# Patient Record
Sex: Female | Born: 1940 | Race: Black or African American | Hispanic: No | Smoking: Former smoker
Health system: Southern US, Community
[De-identification: ages and names within clinical notes are randomized; demographics above are authoritative.]

## PROBLEM LIST (undated history)

## (undated) DIAGNOSIS — IMO0001 Reserved for inherently not codable concepts without codable children: Secondary | ICD-10-CM

## (undated) DIAGNOSIS — I679 Cerebrovascular disease, unspecified: Secondary | ICD-10-CM

## (undated) DIAGNOSIS — E785 Hyperlipidemia, unspecified: Secondary | ICD-10-CM

## (undated) DIAGNOSIS — I6502 Occlusion and stenosis of left vertebral artery: Secondary | ICD-10-CM

## (undated) DIAGNOSIS — I25119 Atherosclerotic heart disease of native coronary artery with unspecified angina pectoris: Secondary | ICD-10-CM

## (undated) DIAGNOSIS — E119 Type 2 diabetes mellitus without complications: Secondary | ICD-10-CM

## (undated) DIAGNOSIS — H04123 Dry eye syndrome of bilateral lacrimal glands: Secondary | ICD-10-CM

## (undated) DIAGNOSIS — K219 Gastro-esophageal reflux disease without esophagitis: Secondary | ICD-10-CM

## (undated) DIAGNOSIS — I10 Essential (primary) hypertension: Secondary | ICD-10-CM

## (undated) HISTORY — DX: Occlusion and stenosis of left vertebral artery: I65.02

## (undated) HISTORY — DX: Essential (primary) hypertension: I10

## (undated) HISTORY — DX: Cerebrovascular disease, unspecified: I67.9

## (undated) HISTORY — DX: Hyperlipidemia, unspecified: E78.5

## (undated) HISTORY — PX: TONSILLECTOMY: SHX5217

## (undated) HISTORY — DX: Reserved for inherently not codable concepts without codable children: IMO0001

## (undated) HISTORY — PX: NO PAST SURGERIES: SHX2092

## (undated) HISTORY — DX: Dry eye syndrome of bilateral lacrimal glands: H04.123

## (undated) HISTORY — DX: Atherosclerotic heart disease of native coronary artery with unspecified angina pectoris: I25.119

## (undated) HISTORY — PX: OTHER SURGICAL HISTORY: SHX169

## (undated) HISTORY — DX: Type 2 diabetes mellitus without complications: E11.9

## (undated) HISTORY — DX: Gastro-esophageal reflux disease without esophagitis: K21.9

---

## 1986-03-29 HISTORY — PX: ABDOMINAL HYSTERECTOMY: SHX81

## 1999-01-21 ENCOUNTER — Encounter: Admission: RE | Admit: 1999-01-21 | Discharge: 1999-01-21 | Payer: Self-pay | Admitting: Internal Medicine

## 1999-01-21 ENCOUNTER — Encounter: Payer: Self-pay | Admitting: Internal Medicine

## 2000-01-11 ENCOUNTER — Encounter: Admission: RE | Admit: 2000-01-11 | Discharge: 2000-04-10 | Payer: Self-pay | Admitting: Internal Medicine

## 2000-01-22 ENCOUNTER — Encounter: Payer: Self-pay | Admitting: Internal Medicine

## 2000-01-22 ENCOUNTER — Encounter: Admission: RE | Admit: 2000-01-22 | Discharge: 2000-01-22 | Payer: Self-pay | Admitting: Internal Medicine

## 2001-01-23 ENCOUNTER — Encounter: Admission: RE | Admit: 2001-01-23 | Discharge: 2001-01-23 | Payer: Self-pay | Admitting: Internal Medicine

## 2001-01-23 ENCOUNTER — Encounter: Payer: Self-pay | Admitting: Internal Medicine

## 2002-03-29 DIAGNOSIS — I25119 Atherosclerotic heart disease of native coronary artery with unspecified angina pectoris: Secondary | ICD-10-CM

## 2002-03-29 HISTORY — DX: Atherosclerotic heart disease of native coronary artery with unspecified angina pectoris: I25.119

## 2002-10-08 ENCOUNTER — Encounter: Payer: Self-pay | Admitting: Internal Medicine

## 2002-10-08 ENCOUNTER — Encounter: Admission: RE | Admit: 2002-10-08 | Discharge: 2002-10-08 | Payer: Self-pay | Admitting: Internal Medicine

## 2002-11-17 ENCOUNTER — Emergency Department (HOSPITAL_COMMUNITY): Admission: EM | Admit: 2002-11-17 | Discharge: 2002-11-17 | Payer: Self-pay | Admitting: Emergency Medicine

## 2003-01-08 ENCOUNTER — Ambulatory Visit (HOSPITAL_COMMUNITY): Admission: RE | Admit: 2003-01-08 | Discharge: 2003-01-09 | Payer: Self-pay | Admitting: Interventional Cardiology

## 2004-01-20 ENCOUNTER — Encounter: Admission: RE | Admit: 2004-01-20 | Discharge: 2004-01-20 | Payer: Self-pay | Admitting: Internal Medicine

## 2004-10-30 ENCOUNTER — Encounter: Admission: RE | Admit: 2004-10-30 | Discharge: 2004-10-30 | Payer: Self-pay | Admitting: Internal Medicine

## 2005-02-01 ENCOUNTER — Encounter: Admission: RE | Admit: 2005-02-01 | Discharge: 2005-02-01 | Payer: Self-pay | Admitting: Gastroenterology

## 2005-02-08 ENCOUNTER — Encounter: Admission: RE | Admit: 2005-02-08 | Discharge: 2005-02-08 | Payer: Self-pay | Admitting: Internal Medicine

## 2006-03-31 ENCOUNTER — Encounter: Admission: RE | Admit: 2006-03-31 | Discharge: 2006-03-31 | Payer: Self-pay | Admitting: Internal Medicine

## 2007-04-12 ENCOUNTER — Encounter: Admission: RE | Admit: 2007-04-12 | Discharge: 2007-04-12 | Payer: Self-pay | Admitting: Internal Medicine

## 2008-04-23 ENCOUNTER — Encounter: Admission: RE | Admit: 2008-04-23 | Discharge: 2008-04-23 | Payer: Self-pay | Admitting: Internal Medicine

## 2009-04-28 ENCOUNTER — Encounter: Admission: RE | Admit: 2009-04-28 | Discharge: 2009-04-28 | Payer: Self-pay | Admitting: Internal Medicine

## 2010-04-18 ENCOUNTER — Other Ambulatory Visit: Payer: Self-pay | Admitting: Internal Medicine

## 2010-04-18 DIAGNOSIS — Z1239 Encounter for other screening for malignant neoplasm of breast: Secondary | ICD-10-CM

## 2010-05-01 ENCOUNTER — Ambulatory Visit: Payer: Self-pay

## 2010-05-14 ENCOUNTER — Ambulatory Visit
Admission: RE | Admit: 2010-05-14 | Discharge: 2010-05-14 | Disposition: A | Discharge status: Home / Self Care | Payer: Medicare Other | Source: Ambulatory Visit | Attending: Internal Medicine | Admitting: Internal Medicine

## 2010-05-14 DIAGNOSIS — Z1239 Encounter for other screening for malignant neoplasm of breast: Secondary | ICD-10-CM

## 2011-05-06 ENCOUNTER — Other Ambulatory Visit: Payer: Self-pay | Admitting: Internal Medicine

## 2011-05-06 DIAGNOSIS — Z1231 Encounter for screening mammogram for malignant neoplasm of breast: Secondary | ICD-10-CM

## 2011-05-19 ENCOUNTER — Ambulatory Visit
Admission: RE | Admit: 2011-05-19 | Discharge: 2011-05-19 | Disposition: A | Discharge status: Home / Self Care | Payer: BC Managed Care – PPO | Source: Ambulatory Visit | Attending: Internal Medicine | Admitting: Internal Medicine

## 2011-05-19 DIAGNOSIS — Z1231 Encounter for screening mammogram for malignant neoplasm of breast: Secondary | ICD-10-CM

## 2011-06-16 DIAGNOSIS — I10 Essential (primary) hypertension: Secondary | ICD-10-CM | POA: Diagnosis not present

## 2011-06-16 DIAGNOSIS — Z1331 Encounter for screening for depression: Secondary | ICD-10-CM | POA: Diagnosis not present

## 2011-06-16 DIAGNOSIS — E119 Type 2 diabetes mellitus without complications: Secondary | ICD-10-CM | POA: Diagnosis not present

## 2011-06-16 DIAGNOSIS — E785 Hyperlipidemia, unspecified: Secondary | ICD-10-CM | POA: Diagnosis not present

## 2011-07-09 DIAGNOSIS — E119 Type 2 diabetes mellitus without complications: Secondary | ICD-10-CM | POA: Diagnosis not present

## 2011-07-09 DIAGNOSIS — H2510 Age-related nuclear cataract, unspecified eye: Secondary | ICD-10-CM | POA: Diagnosis not present

## 2011-10-26 DIAGNOSIS — I10 Essential (primary) hypertension: Secondary | ICD-10-CM | POA: Diagnosis not present

## 2011-10-26 DIAGNOSIS — R05 Cough: Secondary | ICD-10-CM | POA: Diagnosis not present

## 2011-10-26 DIAGNOSIS — E119 Type 2 diabetes mellitus without complications: Secondary | ICD-10-CM | POA: Diagnosis not present

## 2011-12-06 DIAGNOSIS — E119 Type 2 diabetes mellitus without complications: Secondary | ICD-10-CM | POA: Diagnosis not present

## 2011-12-06 DIAGNOSIS — I679 Cerebrovascular disease, unspecified: Secondary | ICD-10-CM | POA: Diagnosis not present

## 2011-12-06 DIAGNOSIS — I2510 Atherosclerotic heart disease of native coronary artery without angina pectoris: Secondary | ICD-10-CM | POA: Diagnosis not present

## 2011-12-06 DIAGNOSIS — I10 Essential (primary) hypertension: Secondary | ICD-10-CM | POA: Diagnosis not present

## 2012-01-11 DIAGNOSIS — H2510 Age-related nuclear cataract, unspecified eye: Secondary | ICD-10-CM | POA: Diagnosis not present

## 2012-01-11 DIAGNOSIS — E119 Type 2 diabetes mellitus without complications: Secondary | ICD-10-CM | POA: Diagnosis not present

## 2012-02-07 DIAGNOSIS — Z23 Encounter for immunization: Secondary | ICD-10-CM | POA: Diagnosis not present

## 2012-02-07 DIAGNOSIS — Z0000 Encounter for general adult medical examination without abnormal findings: Secondary | ICD-10-CM | POA: Diagnosis not present

## 2012-02-07 DIAGNOSIS — Z1331 Encounter for screening for depression: Secondary | ICD-10-CM | POA: Diagnosis not present

## 2012-02-07 DIAGNOSIS — I10 Essential (primary) hypertension: Secondary | ICD-10-CM | POA: Diagnosis not present

## 2012-02-07 DIAGNOSIS — N390 Urinary tract infection, site not specified: Secondary | ICD-10-CM | POA: Diagnosis not present

## 2012-02-07 DIAGNOSIS — E119 Type 2 diabetes mellitus without complications: Secondary | ICD-10-CM | POA: Diagnosis not present

## 2012-03-09 DIAGNOSIS — H524 Presbyopia: Secondary | ICD-10-CM | POA: Diagnosis not present

## 2012-05-15 DIAGNOSIS — R05 Cough: Secondary | ICD-10-CM | POA: Diagnosis not present

## 2012-05-15 DIAGNOSIS — R509 Fever, unspecified: Secondary | ICD-10-CM | POA: Diagnosis not present

## 2012-06-05 DIAGNOSIS — N390 Urinary tract infection, site not specified: Secondary | ICD-10-CM | POA: Diagnosis not present

## 2012-06-05 DIAGNOSIS — E119 Type 2 diabetes mellitus without complications: Secondary | ICD-10-CM | POA: Diagnosis not present

## 2012-06-05 DIAGNOSIS — I10 Essential (primary) hypertension: Secondary | ICD-10-CM | POA: Diagnosis not present

## 2012-06-26 ENCOUNTER — Other Ambulatory Visit: Payer: Self-pay

## 2012-06-26 DIAGNOSIS — Z1231 Encounter for screening mammogram for malignant neoplasm of breast: Secondary | ICD-10-CM

## 2012-07-26 ENCOUNTER — Ambulatory Visit
Admission: RE | Admit: 2012-07-26 | Discharge: 2012-07-26 | Disposition: A | Discharge status: Home / Self Care | Payer: Medicare Other | Source: Ambulatory Visit

## 2012-07-26 DIAGNOSIS — Z1231 Encounter for screening mammogram for malignant neoplasm of breast: Secondary | ICD-10-CM

## 2012-12-04 DIAGNOSIS — I679 Cerebrovascular disease, unspecified: Secondary | ICD-10-CM | POA: Diagnosis not present

## 2012-12-04 DIAGNOSIS — E785 Hyperlipidemia, unspecified: Secondary | ICD-10-CM | POA: Diagnosis not present

## 2012-12-04 DIAGNOSIS — I2510 Atherosclerotic heart disease of native coronary artery without angina pectoris: Secondary | ICD-10-CM | POA: Diagnosis not present

## 2012-12-04 DIAGNOSIS — I10 Essential (primary) hypertension: Secondary | ICD-10-CM | POA: Diagnosis not present

## 2013-04-05 DIAGNOSIS — I10 Essential (primary) hypertension: Secondary | ICD-10-CM | POA: Diagnosis not present

## 2013-04-05 DIAGNOSIS — E785 Hyperlipidemia, unspecified: Secondary | ICD-10-CM | POA: Diagnosis not present

## 2013-04-05 DIAGNOSIS — Z23 Encounter for immunization: Secondary | ICD-10-CM | POA: Diagnosis not present

## 2013-04-05 DIAGNOSIS — E119 Type 2 diabetes mellitus without complications: Secondary | ICD-10-CM | POA: Diagnosis not present

## 2013-06-13 DIAGNOSIS — E1139 Type 2 diabetes mellitus with other diabetic ophthalmic complication: Secondary | ICD-10-CM | POA: Diagnosis not present

## 2013-06-13 DIAGNOSIS — E1136 Type 2 diabetes mellitus with diabetic cataract: Secondary | ICD-10-CM | POA: Diagnosis not present

## 2013-06-26 ENCOUNTER — Other Ambulatory Visit: Payer: Self-pay

## 2013-06-26 DIAGNOSIS — Z1231 Encounter for screening mammogram for malignant neoplasm of breast: Secondary | ICD-10-CM

## 2013-07-27 ENCOUNTER — Encounter (INDEPENDENT_AMBULATORY_CARE_PROVIDER_SITE_OTHER): Payer: Self-pay

## 2013-07-27 ENCOUNTER — Ambulatory Visit
Admission: RE | Admit: 2013-07-27 | Discharge: 2013-07-27 | Disposition: A | Discharge status: Home / Self Care | Payer: Medicare Other | Source: Ambulatory Visit

## 2013-07-27 DIAGNOSIS — Z1231 Encounter for screening mammogram for malignant neoplasm of breast: Secondary | ICD-10-CM | POA: Diagnosis not present

## 2013-08-02 DIAGNOSIS — E119 Type 2 diabetes mellitus without complications: Secondary | ICD-10-CM | POA: Diagnosis not present

## 2013-08-02 DIAGNOSIS — Z23 Encounter for immunization: Secondary | ICD-10-CM | POA: Diagnosis not present

## 2013-08-02 DIAGNOSIS — I10 Essential (primary) hypertension: Secondary | ICD-10-CM | POA: Diagnosis not present

## 2013-08-02 DIAGNOSIS — Z1331 Encounter for screening for depression: Secondary | ICD-10-CM | POA: Diagnosis not present

## 2013-09-20 DIAGNOSIS — R21 Rash and other nonspecific skin eruption: Secondary | ICD-10-CM | POA: Diagnosis not present

## 2013-10-09 DIAGNOSIS — R21 Rash and other nonspecific skin eruption: Secondary | ICD-10-CM | POA: Diagnosis not present

## 2013-12-04 DIAGNOSIS — I10 Essential (primary) hypertension: Secondary | ICD-10-CM | POA: Diagnosis not present

## 2013-12-04 DIAGNOSIS — E785 Hyperlipidemia, unspecified: Secondary | ICD-10-CM | POA: Diagnosis not present

## 2013-12-04 DIAGNOSIS — Z23 Encounter for immunization: Secondary | ICD-10-CM | POA: Diagnosis not present

## 2013-12-04 DIAGNOSIS — Z1331 Encounter for screening for depression: Secondary | ICD-10-CM | POA: Diagnosis not present

## 2013-12-04 DIAGNOSIS — E119 Type 2 diabetes mellitus without complications: Secondary | ICD-10-CM | POA: Diagnosis not present

## 2014-03-26 DIAGNOSIS — M5432 Sciatica, left side: Secondary | ICD-10-CM | POA: Diagnosis not present

## 2014-04-09 ENCOUNTER — Other Ambulatory Visit: Payer: Self-pay | Admitting: Internal Medicine

## 2014-04-09 DIAGNOSIS — M5430 Sciatica, unspecified side: Secondary | ICD-10-CM | POA: Diagnosis not present

## 2014-04-09 DIAGNOSIS — E1151 Type 2 diabetes mellitus with diabetic peripheral angiopathy without gangrene: Secondary | ICD-10-CM | POA: Diagnosis not present

## 2014-04-09 DIAGNOSIS — K56699 Other intestinal obstruction unspecified as to partial versus complete obstruction: Secondary | ICD-10-CM

## 2014-04-09 DIAGNOSIS — I10 Essential (primary) hypertension: Secondary | ICD-10-CM | POA: Diagnosis not present

## 2014-04-09 DIAGNOSIS — I739 Peripheral vascular disease, unspecified: Secondary | ICD-10-CM | POA: Diagnosis not present

## 2014-04-22 ENCOUNTER — Inpatient Hospital Stay
Admission: RE | Admit: 2014-04-22 | Discharge: 2014-04-22 | Disposition: A | Discharge status: Home / Self Care | Payer: Medicare Other | Source: Ambulatory Visit | Attending: Internal Medicine | Admitting: Internal Medicine

## 2014-08-30 DIAGNOSIS — E1151 Type 2 diabetes mellitus with diabetic peripheral angiopathy without gangrene: Secondary | ICD-10-CM | POA: Diagnosis not present

## 2014-08-30 DIAGNOSIS — I10 Essential (primary) hypertension: Secondary | ICD-10-CM | POA: Diagnosis not present

## 2014-08-30 DIAGNOSIS — Z23 Encounter for immunization: Secondary | ICD-10-CM | POA: Diagnosis not present

## 2015-01-06 DIAGNOSIS — E119 Type 2 diabetes mellitus without complications: Secondary | ICD-10-CM | POA: Diagnosis not present

## 2015-01-06 DIAGNOSIS — E7800 Pure hypercholesterolemia, unspecified: Secondary | ICD-10-CM | POA: Diagnosis not present

## 2015-01-06 DIAGNOSIS — I10 Essential (primary) hypertension: Secondary | ICD-10-CM | POA: Diagnosis not present

## 2015-01-06 DIAGNOSIS — Z1389 Encounter for screening for other disorder: Secondary | ICD-10-CM | POA: Diagnosis not present

## 2015-01-06 DIAGNOSIS — Z23 Encounter for immunization: Secondary | ICD-10-CM | POA: Diagnosis not present

## 2015-01-16 DIAGNOSIS — H25013 Cortical age-related cataract, bilateral: Secondary | ICD-10-CM | POA: Diagnosis not present

## 2015-01-16 DIAGNOSIS — Z0100 Encounter for examination of eyes and vision without abnormal findings: Secondary | ICD-10-CM | POA: Diagnosis not present

## 2015-01-16 DIAGNOSIS — E119 Type 2 diabetes mellitus without complications: Secondary | ICD-10-CM | POA: Diagnosis not present

## 2015-01-27 ENCOUNTER — Other Ambulatory Visit: Payer: Self-pay

## 2015-01-27 DIAGNOSIS — Z1231 Encounter for screening mammogram for malignant neoplasm of breast: Secondary | ICD-10-CM

## 2015-02-27 ENCOUNTER — Ambulatory Visit
Admission: RE | Admit: 2015-02-27 | Discharge: 2015-02-27 | Disposition: A | Discharge status: Home / Self Care | Payer: Medicare Other | Source: Ambulatory Visit

## 2015-02-27 DIAGNOSIS — Z1231 Encounter for screening mammogram for malignant neoplasm of breast: Secondary | ICD-10-CM

## 2015-04-03 DIAGNOSIS — M5430 Sciatica, unspecified side: Secondary | ICD-10-CM | POA: Diagnosis not present

## 2015-04-03 DIAGNOSIS — M25569 Pain in unspecified knee: Secondary | ICD-10-CM | POA: Diagnosis not present

## 2015-05-09 DIAGNOSIS — I10 Essential (primary) hypertension: Secondary | ICD-10-CM | POA: Insufficient documentation

## 2015-05-09 DIAGNOSIS — E785 Hyperlipidemia, unspecified: Secondary | ICD-10-CM | POA: Insufficient documentation

## 2015-05-09 DIAGNOSIS — K219 Gastro-esophageal reflux disease without esophagitis: Secondary | ICD-10-CM

## 2015-05-09 DIAGNOSIS — E119 Type 2 diabetes mellitus without complications: Secondary | ICD-10-CM | POA: Insufficient documentation

## 2015-05-09 DIAGNOSIS — I25119 Atherosclerotic heart disease of native coronary artery with unspecified angina pectoris: Secondary | ICD-10-CM | POA: Insufficient documentation

## 2015-05-09 DIAGNOSIS — H04123 Dry eye syndrome of bilateral lacrimal glands: Secondary | ICD-10-CM | POA: Insufficient documentation

## 2015-05-09 DIAGNOSIS — I6502 Occlusion and stenosis of left vertebral artery: Secondary | ICD-10-CM | POA: Insufficient documentation

## 2015-05-09 DIAGNOSIS — IMO0001 Reserved for inherently not codable concepts without codable children: Secondary | ICD-10-CM | POA: Insufficient documentation

## 2015-05-09 DIAGNOSIS — I679 Cerebrovascular disease, unspecified: Secondary | ICD-10-CM | POA: Insufficient documentation

## 2015-05-13 ENCOUNTER — Ambulatory Visit
Admission: RE | Admit: 2015-05-13 | Discharge: 2015-05-13 | Disposition: A | Discharge status: Home / Self Care | Payer: Medicare Other | Source: Ambulatory Visit | Attending: Internal Medicine | Admitting: Internal Medicine

## 2015-05-13 ENCOUNTER — Other Ambulatory Visit: Payer: Self-pay | Admitting: Internal Medicine

## 2015-05-13 DIAGNOSIS — Z7984 Long term (current) use of oral hypoglycemic drugs: Secondary | ICD-10-CM | POA: Diagnosis not present

## 2015-05-13 DIAGNOSIS — M25562 Pain in left knee: Secondary | ICD-10-CM

## 2015-05-13 DIAGNOSIS — M5432 Sciatica, left side: Secondary | ICD-10-CM | POA: Diagnosis not present

## 2015-05-13 DIAGNOSIS — E119 Type 2 diabetes mellitus without complications: Secondary | ICD-10-CM | POA: Diagnosis not present

## 2015-05-13 DIAGNOSIS — I10 Essential (primary) hypertension: Secondary | ICD-10-CM | POA: Diagnosis not present

## 2015-05-13 DIAGNOSIS — M179 Osteoarthritis of knee, unspecified: Secondary | ICD-10-CM | POA: Diagnosis not present

## 2015-05-15 DIAGNOSIS — M25562 Pain in left knee: Secondary | ICD-10-CM | POA: Diagnosis not present

## 2015-05-15 DIAGNOSIS — M545 Low back pain: Secondary | ICD-10-CM | POA: Diagnosis not present

## 2015-05-16 ENCOUNTER — Encounter: Payer: Self-pay | Admitting: Interventional Cardiology

## 2015-05-16 ENCOUNTER — Ambulatory Visit (INDEPENDENT_AMBULATORY_CARE_PROVIDER_SITE_OTHER): Payer: Medicare Other | Admitting: Interventional Cardiology

## 2015-05-16 VITALS — BP 132/74 | HR 81 | Ht 69.0 in | Wt 141.8 lb

## 2015-05-16 DIAGNOSIS — E785 Hyperlipidemia, unspecified: Secondary | ICD-10-CM

## 2015-05-16 DIAGNOSIS — I10 Essential (primary) hypertension: Secondary | ICD-10-CM | POA: Diagnosis not present

## 2015-05-16 DIAGNOSIS — E119 Type 2 diabetes mellitus without complications: Secondary | ICD-10-CM | POA: Diagnosis not present

## 2015-05-16 DIAGNOSIS — I25119 Atherosclerotic heart disease of native coronary artery with unspecified angina pectoris: Secondary | ICD-10-CM | POA: Diagnosis not present

## 2015-05-16 DIAGNOSIS — I6502 Occlusion and stenosis of left vertebral artery: Secondary | ICD-10-CM

## 2015-05-16 NOTE — Progress Notes (Signed)
Cardiology Office Note   Date:  05/16/2015   ID:  Mackenzie Gibbs, DOB 12/14/1940, MRN [id number]  PCP:  Irven Shelling, MD  Cardiologist:  Sinclair Grooms, MD   Chief Complaint  Patient presents with  . Coronary Artery Disease      History of Present Illness: Mackenzie Gibbs is a 75 y.o. female who presents for  Diabetes type II with vascular complications,  Cerebrovascular disease,  Hyperlipidemia, and essential hypertension.  She is doing well. I have not seen her in slightly more than 2 years. She has been active. She denies angina. There is no peripheral edema. She has not had palpitations or episodes of syncope.  Past Medical History  Diagnosis Date  . 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 Diabetes mellitus without complication ([place])   . Hypertension   . Hyperlipidemia   . Cerebrovascular disease   . Stenosis of left vertebral artery     75%  . Reflux   . Dry eyes     Past Surgical History  Procedure Laterality Date  . No past surgeries       Current Outpatient Prescriptions  Medication Sig Dispense Refill  . aspirin 325 MG tablet Take 325 mg by mouth daily.    [name] atorvastatin (LIPITOR) 20 MG tablet Take 20 mg by mouth daily.    . beta carotene w/minerals (OCUVITE) tablet Take 1 tablet by mouth daily.    . cycloSPORINE (RESTASIS) 0.05 % ophthalmic emulsion Place 1 drop into both eyes daily.     [name] glucose blood test strip 1 each by Other route as needed for other. Use as instructed    . Lancets 30G MISC by Does not apply route. ONE TOUCH ULTRA 250.00    . lisinopril-hydrochlorothiazide (PRINZIDE,ZESTORETIC) 10-12.5 MG per tablet Take 1 tablet by mouth daily.    . metFORMIN (GLUCOPHAGE) 500 MG tablet Take by mouth 2 (two) times daily with a meal.    . nitroGLYCERIN (NITROSTAT) 0.4 MG SL tablet Place 0.4 mg under the tongue every 5 (five) minutes as needed for chest pain.      No current facility-administered medications for this visit.    Allergies:   Dristan cold and Kiwi extract    Social History:  The patient  reports that she has quit smoking. She has never used smokeless tobacco. She reports that she drinks alcohol. She reports that she does not use illicit drugs.   Family History:  The patient's family history includes Coronary artery disease in her brother; Hypertension in her mother; Stroke in her father.    ROS:  Please see the history of present illness.   Otherwise, review of systems are positive for  Left knee swelling and discomfort. Muscle pain. His also has some low back discomfort. This is limited her exertional tolerance /efforts..   All other systems are reviewed and negative.    PHYSICAL EXAM: VS:  BP 132/74 mmHg  Pulse 81  Ht 5\' 9"  (1.753 m)  Wt 141 lb 12.8 oz (64.32 kg)  BMI 20.93 kg/m2 , BMI Body mass index is 20.93 kg/(m^2). GEN: Well nourished, well developed, in no acute distress HEENT: normal Neck: no JVD, carotid bruits, or masses Cardiac: RRR.  There is no murmur, rub, or gallop. There is no edema. Respiratory:  clear to auscultation bilaterally, normal work of breathing. GI: soft, nontender, nondistended, + BS MS: no deformity or  atrophy Skin: warm and dry, no rash Neuro:  Strength and sensation are intact Psych: euthymic mood, full affect   EKG:  EKG is ordered today. The ekg reveals  Normal sinus rhythm with left axis deviation an prominent voltage   Recent Labs: No results found for requested labs within last 365 days.    Lipid Panel No results found for: CHOL, TRIG, HDL, CHOLHDL, VLDL, LDLCALC, LDLDIRECT    Wt Readings from Last 3 Encounters:  05/16/15 141 lb 12.8 oz (64.32 kg)      Other studies Reviewed: Additional studies/ records that were reviewed today include: no. The findings include no.    ASSESSMENT AND PLAN:  1. Atherosclerosis of native coronary artery of native heart with angina  pectoris [facility])  She is status post right coronary and circumflex coronary artery Taxus drug-eluting stents in 2004. Asymptomatic since that time from anginal / ischemia perspective.  2. Diabetes mellitus without complication ([place])  managed and followed by Dr. Lavone Orn  3. Hyperlipidemia  management followed by Dr. Lavone Orn  4. Essential hypertension  verygood control  5. Stenosis of left vertebral artery  asymptomatic    Current medicines are reviewed at length with the patient today.  The patient has the following concerns regarding medicines: none.  The following changes/actions have been instituted:     maintain an active lifestyle  Labs/ tests ordered today include:  No orders of the defined types were placed in this encounter.     Disposition:   FU with HS in 1 year  Signed, Sinclair Grooms, MD  05/16/2015 2:25 PM    [facility] [address], [place], [place]  [address] Phone: [phone]; Fax: [phone]

## 2015-05-16 NOTE — Patient Instructions (Signed)
Medication Instructions:  Your physician recommends that you continue on your current medications as directed. Please refer to the Current Medication list given to you today.  Labwork: NONE  Testing/Procedures: NONE  Follow-Up: Your physician wants you to follow-up in: 1 months with Dr. Tamala Julian. You will receive a reminder letter in the mail two months in advance. If you don't receive a letter, please call our office to schedule the follow-up appointment.  Your physician wants you to keep active with walking, etc.  If you need a refill on your cardiac medications before your next appointment, please call your pharmacy.

## 2015-05-20 DIAGNOSIS — M25562 Pain in left knee: Secondary | ICD-10-CM | POA: Diagnosis not present

## 2015-05-20 DIAGNOSIS — M545 Low back pain: Secondary | ICD-10-CM | POA: Diagnosis not present

## 2015-05-22 DIAGNOSIS — M25562 Pain in left knee: Secondary | ICD-10-CM | POA: Diagnosis not present

## 2015-05-22 DIAGNOSIS — M545 Low back pain: Secondary | ICD-10-CM | POA: Diagnosis not present

## 2015-05-27 DIAGNOSIS — M25562 Pain in left knee: Secondary | ICD-10-CM | POA: Diagnosis not present

## 2015-05-27 DIAGNOSIS — M545 Low back pain: Secondary | ICD-10-CM | POA: Diagnosis not present

## 2015-05-29 DIAGNOSIS — M25562 Pain in left knee: Secondary | ICD-10-CM | POA: Diagnosis not present

## 2015-05-29 DIAGNOSIS — M545 Low back pain: Secondary | ICD-10-CM | POA: Diagnosis not present

## 2015-06-10 DIAGNOSIS — M545 Low back pain: Secondary | ICD-10-CM | POA: Diagnosis not present

## 2015-06-10 DIAGNOSIS — M25562 Pain in left knee: Secondary | ICD-10-CM | POA: Diagnosis not present

## 2015-06-12 DIAGNOSIS — M25562 Pain in left knee: Secondary | ICD-10-CM | POA: Diagnosis not present

## 2015-06-12 DIAGNOSIS — M545 Low back pain: Secondary | ICD-10-CM | POA: Diagnosis not present

## 2015-06-24 DIAGNOSIS — M25562 Pain in left knee: Secondary | ICD-10-CM | POA: Diagnosis not present

## 2015-06-24 DIAGNOSIS — M545 Low back pain: Secondary | ICD-10-CM | POA: Diagnosis not present

## 2015-06-26 DIAGNOSIS — M545 Low back pain: Secondary | ICD-10-CM | POA: Diagnosis not present

## 2015-06-26 DIAGNOSIS — M25562 Pain in left knee: Secondary | ICD-10-CM | POA: Diagnosis not present

## 2015-07-01 DIAGNOSIS — M25562 Pain in left knee: Secondary | ICD-10-CM | POA: Diagnosis not present

## 2015-07-01 DIAGNOSIS — M545 Low back pain: Secondary | ICD-10-CM | POA: Diagnosis not present

## 2015-07-02 DIAGNOSIS — R300 Dysuria: Secondary | ICD-10-CM | POA: Diagnosis not present

## 2015-07-03 DIAGNOSIS — M25562 Pain in left knee: Secondary | ICD-10-CM | POA: Diagnosis not present

## 2015-07-03 DIAGNOSIS — M545 Low back pain: Secondary | ICD-10-CM | POA: Diagnosis not present

## 2015-08-20 DIAGNOSIS — Z1211 Encounter for screening for malignant neoplasm of colon: Secondary | ICD-10-CM | POA: Diagnosis not present

## 2015-08-20 DIAGNOSIS — Z1212 Encounter for screening for malignant neoplasm of rectum: Secondary | ICD-10-CM | POA: Diagnosis not present

## 2015-09-11 DIAGNOSIS — E119 Type 2 diabetes mellitus without complications: Secondary | ICD-10-CM | POA: Diagnosis not present

## 2015-09-11 DIAGNOSIS — Z7984 Long term (current) use of oral hypoglycemic drugs: Secondary | ICD-10-CM | POA: Diagnosis not present

## 2015-09-11 DIAGNOSIS — I10 Essential (primary) hypertension: Secondary | ICD-10-CM | POA: Diagnosis not present

## 2016-01-07 DIAGNOSIS — Z1389 Encounter for screening for other disorder: Secondary | ICD-10-CM | POA: Diagnosis not present

## 2016-01-07 DIAGNOSIS — Z0000 Encounter for general adult medical examination without abnormal findings: Secondary | ICD-10-CM | POA: Diagnosis not present

## 2016-01-07 DIAGNOSIS — I10 Essential (primary) hypertension: Secondary | ICD-10-CM | POA: Diagnosis not present

## 2016-01-07 DIAGNOSIS — Z23 Encounter for immunization: Secondary | ICD-10-CM | POA: Diagnosis not present

## 2016-01-07 DIAGNOSIS — N898 Other specified noninflammatory disorders of vagina: Secondary | ICD-10-CM | POA: Diagnosis not present

## 2016-01-07 DIAGNOSIS — E1151 Type 2 diabetes mellitus with diabetic peripheral angiopathy without gangrene: Secondary | ICD-10-CM | POA: Diagnosis not present

## 2016-01-07 DIAGNOSIS — I739 Peripheral vascular disease, unspecified: Secondary | ICD-10-CM | POA: Diagnosis not present

## 2016-01-07 DIAGNOSIS — Z7984 Long term (current) use of oral hypoglycemic drugs: Secondary | ICD-10-CM | POA: Diagnosis not present

## 2016-01-07 DIAGNOSIS — E7800 Pure hypercholesterolemia, unspecified: Secondary | ICD-10-CM | POA: Diagnosis not present

## 2016-01-27 ENCOUNTER — Other Ambulatory Visit: Payer: Self-pay | Admitting: Internal Medicine

## 2016-01-27 DIAGNOSIS — Z1231 Encounter for screening mammogram for malignant neoplasm of breast: Secondary | ICD-10-CM

## 2016-03-01 ENCOUNTER — Ambulatory Visit
Admission: RE | Admit: 2016-03-01 | Discharge: 2016-03-01 | Disposition: A | Discharge status: Home / Self Care | Payer: Medicare Other | Source: Ambulatory Visit | Attending: Internal Medicine | Admitting: Internal Medicine

## 2016-03-01 DIAGNOSIS — Z1231 Encounter for screening mammogram for malignant neoplasm of breast: Secondary | ICD-10-CM

## 2016-05-17 ENCOUNTER — Encounter (INDEPENDENT_AMBULATORY_CARE_PROVIDER_SITE_OTHER): Payer: Self-pay

## 2016-05-17 ENCOUNTER — Ambulatory Visit (INDEPENDENT_AMBULATORY_CARE_PROVIDER_SITE_OTHER): Payer: Self-pay | Admitting: Interventional Cardiology

## 2016-05-17 ENCOUNTER — Encounter: Payer: Self-pay | Admitting: Interventional Cardiology

## 2016-05-17 VITALS — BP 100/64 | HR 91 | Ht 67.0 in | Wt 141.8 lb

## 2016-05-17 DIAGNOSIS — E784 Other hyperlipidemia: Secondary | ICD-10-CM

## 2016-05-17 DIAGNOSIS — I10 Essential (primary) hypertension: Secondary | ICD-10-CM

## 2016-05-17 DIAGNOSIS — I6502 Occlusion and stenosis of left vertebral artery: Secondary | ICD-10-CM

## 2016-05-17 DIAGNOSIS — E7849 Other hyperlipidemia: Secondary | ICD-10-CM

## 2016-05-17 DIAGNOSIS — I25119 Atherosclerotic heart disease of native coronary artery with unspecified angina pectoris: Secondary | ICD-10-CM

## 2016-05-17 MED ORDER — NITROGLYCERIN 0.4 MG SL SUBL: 0.4000 mg | SUBLINGUAL_TABLET | SUBLINGUAL | 1 refills | Status: DC | PRN

## 2016-05-17 NOTE — Patient Instructions (Signed)

## 2016-05-17 NOTE — Progress Notes (Signed)
Cardiology Office Note    Date:  05/17/2016   ID:  MELIKE KLOMP, DOB [birth year], MRN [id number]  PCP:  Irven Shelling, MD  Cardiologist: Sinclair Grooms, MD   Chief Complaint  Patient presents with  . 1 year follow up    History of Present Illness:  Mackenzie Gibbs is a 76 y.o. female who presents for  Diabetes type II with vascular complications, cerebrovascular disease,  Hyperlipidemia, CAD with prior first-generation DES to circumflex and right coronary artery 2004, and essential hypertension.  She denies cardiac symptoms. Remains very active. No shortness of breath. Has never used nitroglycerin. Complies with medical regimen although states that her blood sugars have not been the best recently. She did not take metformin as directed by her primary physician.  Past Medical History:  Diagnosis Date  . 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 Cerebrovascular disease   . Diabetes mellitus without complication ([place])   . Dry eyes   . Hyperlipidemia   . Hypertension   . Reflux   . Stenosis of left vertebral artery    75%    Past Surgical History:  Procedure Laterality Date  . NO PAST SURGERIES      Current Medications: Outpatient Medications Prior to Visit  Medication Sig Dispense Refill  . aspirin 325 MG tablet Take 325 mg by mouth daily.    [name] atorvastatin (LIPITOR) 20 MG tablet Take 20 mg by mouth daily.    . beta carotene w/minerals (OCUVITE) tablet Take 1 tablet by mouth daily.    . cycloSPORINE (RESTASIS) 0.05 % ophthalmic emulsion Place 1 drop into both eyes daily.     [name] glucose blood test strip 1 each by Other route as needed for other. Use as instructed    . Lancets 30G MISC by Does not apply route. ONE TOUCH ULTRA 250.00    . lisinopril-hydrochlorothiazide (PRINZIDE,ZESTORETIC) 10-12.5 MG per tablet Take 1 tablet by mouth daily.    . metFORMIN (GLUCOPHAGE) 500 MG tablet  Take by mouth 2 (two) times daily with a meal.    . nitroGLYCERIN (NITROSTAT) 0.4 MG SL tablet Place 0.4 mg under the tongue every 5 (five) minutes as needed for chest pain.     No facility-administered medications prior to visit.      Allergies:   Dristan cold [chlorphen-pe-acetaminophen] and Kiwi extract   Social History   Social History  . Marital status: Divorced    Spouse name: N/A  . Number of children: N/A  . Years of education: N/A   Social History Main Topics  . Smoking status: Former Research scientist (life sciences)  . Smokeless tobacco: Never Used  . Alcohol use 0.0 oz/week  . Drug use: No  . Sexual activity: Not Asked   Other Topics Concern  . None   Social History Narrative  . None     Family History:  The patient's family history includes Coronary artery disease in her brother; Hypertension in her mother; Stroke in her father.   ROS:   Please see the history of present illness.    Cough, and unexplained weight loss  All other systems reviewed and are negative.   PHYSICAL EXAM:   VS:  BP 100/64   Pulse 91   Ht 5\' 7"  (1.702 m)   Wt 141 lb 12.8 oz (64.3 kg)   SpO2 93%   BMI 22.21 kg/m    GEN: Well nourished, well developed,  in no acute distress  HEENT: normal  Neck: no JVD, carotid bruits, or masses Cardiac: RRR; no murmurs, rubs, or gallops,no edema  Respiratory:  clear to auscultation bilaterally, normal work of breathing GI: soft, nontender, nondistended, + BS MS: no deformity or atrophy  Skin: warm and dry, no rash Neuro:  Alert and Oriented x 3, Strength and sensation are intact Psych: euthymic mood, full affect  Wt Readings from Last 3 Encounters:  05/17/16 141 lb 12.8 oz (64.3 kg)  05/16/15 141 lb 12.8 oz (64.3 kg)      Studies/Labs Reviewed:   EKG:  EKG  Normal sinus rhythm, poor R-wave progression V1 through V4. Left axis deviation. No change compared to the prior tracings.  Recent Labs: No results found for requested labs within last 8760 hours.    Lipid Panel No results found for: CHOL, TRIG, HDL, CHOLHDL, VLDL, LDLCALC, LDLDIRECT  Additional studies/ records that were reviewed today include:  No recent functional data.    ASSESSMENT:    1. Atherosclerosis of native coronary artery of native heart with angina pectoris ([place])   2. Stenosis of left vertebral artery   3. Essential hypertension   4. Other hyperlipidemia      PLAN:  In order of problems listed above:  1. Asymptomatic. Nitroglycerin has not been required. Physically active without limitations. 2. No neurological complaints. 3. Excellent blood pressure control with my repeat value of 120/70 mmHg. 4. Followed by primary care physician Dr. Lavone Orn. Target LDL less than 70.  Encouraged aerobic activity. Report any chest discomfort or excessive dyspnea. Clinical follow-up in one year. Consider functional testing in one year.  Medication Adjustments/Labs and Tests Ordered: Current medicines are reviewed at length with the patient today.  Concerns regarding medicines are outlined above.  Medication changes, Labs and Tests ordered today are listed in the Patient Instructions below. There are no Patient Instructions on file for this visit.   Signed, Sinclair Grooms, MD  05/17/2016 4:06 PM    [facility] [place], [place], [place]  [address] Phone: [phone]; Fax: [phone]

## 2017-02-02 ENCOUNTER — Other Ambulatory Visit: Payer: Self-pay | Admitting: Internal Medicine

## 2017-02-02 DIAGNOSIS — Z1231 Encounter for screening mammogram for malignant neoplasm of breast: Secondary | ICD-10-CM

## 2017-03-03 ENCOUNTER — Ambulatory Visit
Admission: RE | Admit: 2017-03-03 | Discharge: 2017-03-03 | Disposition: A | Discharge status: Home / Self Care | Payer: Medicare Other | Source: Ambulatory Visit | Attending: Internal Medicine | Admitting: Internal Medicine

## 2017-03-03 DIAGNOSIS — Z1231 Encounter for screening mammogram for malignant neoplasm of breast: Secondary | ICD-10-CM

## 2017-03-04 ENCOUNTER — Other Ambulatory Visit: Payer: Self-pay | Admitting: Internal Medicine

## 2017-03-04 DIAGNOSIS — R928 Other abnormal and inconclusive findings on diagnostic imaging of breast: Secondary | ICD-10-CM

## 2017-03-09 ENCOUNTER — Ambulatory Visit
Admission: RE | Admit: 2017-03-09 | Discharge: 2017-03-09 | Disposition: A | Discharge status: Home / Self Care | Payer: Medicare Other | Source: Ambulatory Visit | Attending: Internal Medicine | Admitting: Internal Medicine

## 2017-03-09 DIAGNOSIS — R928 Other abnormal and inconclusive findings on diagnostic imaging of breast: Secondary | ICD-10-CM

## 2017-05-18 ENCOUNTER — Encounter: Payer: Self-pay | Admitting: Interventional Cardiology

## 2017-05-25 ENCOUNTER — Ambulatory Visit: Payer: Medicare Other | Admitting: Interventional Cardiology

## 2017-08-26 ENCOUNTER — Encounter: Payer: Self-pay | Admitting: Interventional Cardiology

## 2017-08-26 ENCOUNTER — Ambulatory Visit: Payer: Medicare Other | Admitting: Interventional Cardiology

## 2017-08-26 ENCOUNTER — Encounter

## 2017-08-26 VITALS — BP 106/70 | HR 74 | Ht 68.0 in | Wt 145.4 lb

## 2017-08-26 DIAGNOSIS — E7849 Other hyperlipidemia: Secondary | ICD-10-CM

## 2017-08-26 DIAGNOSIS — E119 Type 2 diabetes mellitus without complications: Secondary | ICD-10-CM

## 2017-08-26 DIAGNOSIS — I10 Essential (primary) hypertension: Secondary | ICD-10-CM | POA: Diagnosis not present

## 2017-08-26 DIAGNOSIS — I25119 Atherosclerotic heart disease of native coronary artery with unspecified angina pectoris: Secondary | ICD-10-CM

## 2017-08-26 NOTE — Progress Notes (Signed)
Cardiology Office Note    Date:  08/26/2017   ID:  Mackenzie Gibbs, DOB October 18, 1940, MRN [id number]  PCP:  Lavone Orn, MD  Cardiologist: Sinclair Grooms, MD   Chief Complaint  Patient presents with  . Coronary Artery Disease    History of Present Illness:  Mackenzie Gibbs is a 77 y.o. female  who presents for Diabetes type II with vascular complications, cerebrovascular disease, Hyperlipidemia, CAD with prior first-generation DES to circumflex and right coronary artery 2004, and essential hypertension.  Mackenzie Gibbs is doing well.  She is not exercising as she once did.  She denies angina, dyspnea, edema, palpitations, syncope, and orthopnea.  Compliant with medication regimen.  Has occasional alcoholic intake.  Occasional dietary indiscretion.   Past Medical History:  Diagnosis Date  . 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 Cerebrovascular disease   . Diabetes mellitus without complication ([place])   . Dry eyes   . Hyperlipidemia   . Hypertension   . Reflux   . Stenosis of left vertebral artery    75%    Past Surgical History:  Procedure Laterality Date  . NO PAST SURGERIES      Current Medications: Outpatient Medications Prior to Visit  Medication Sig Dispense Refill  . aspirin EC 81 MG tablet Take 81 mg by mouth daily.    [name] atorvastatin (LIPITOR) 20 MG tablet Take 20 mg by mouth daily.    . beta carotene w/minerals (OCUVITE) tablet Take 1 tablet by mouth daily.    . cycloSPORINE (RESTASIS) 0.05 % ophthalmic emulsion Place 1 drop into both eyes daily.     [name] glucose blood test strip 1 each by Other route as needed for other. Use as instructed    . Lancets 30G MISC by Does not apply route. ONE TOUCH ULTRA 250.00    . lisinopril-hydrochlorothiazide (PRINZIDE,ZESTORETIC) 10-12.5 MG per tablet Take 1 tablet by mouth daily.    . metFORMIN (GLUCOPHAGE) 500 MG tablet Take by mouth 2 (two) times  daily with a meal.    . nitroGLYCERIN (NITROSTAT) 0.4 MG SL tablet Place 1 tablet (0.4 mg total) under the tongue every 5 (five) minutes as needed for chest pain. 25 tablet 1  . aspirin 325 MG tablet Take 325 mg by mouth daily.     No facility-administered medications prior to visit.      Allergies:   Dristan cold [chlorphen-pe-acetaminophen] and Kiwi extract   Social History   Socioeconomic History  . Marital status: Divorced    Spouse name: Not on file  . Number of children: Not on file  . Years of education: Not on file  . Highest education level: Not on file  Occupational History  . Not on file  Social Needs  . Financial resource strain: Not on file  . Food insecurity:    Worry: Not on file    Inability: Not on file  . Transportation needs:    Medical: Not on file    Non-medical: Not on file  Tobacco Use  . Smoking status: Former Research scientist (life sciences)  . Smokeless tobacco: Never Used  Substance and Sexual Activity  . Alcohol use: Yes    Alcohol/week: 0.0 oz  . Drug use: No  . Sexual activity: Not on file  Lifestyle  . Physical activity:    Days per week: Not on file    Minutes per session: Not on file  . Stress:  Not on file  Relationships  . Social connections:    Talks on phone: Not on file    Gets together: Not on file    Attends religious service: Not on file    Active member of club or organization: Not on file    Attends meetings of clubs or organizations: Not on file    Relationship status: Not on file  Other Topics Concern  . Not on file  Social History Narrative  . Not on file     Family History:  The patient's family history includes Alcohol abuse in her unknown relative; Arthritis in her unknown relative; Breast cancer in her unknown relative; Breast cancer (age of onset: 75) in her mother; Coronary artery disease in her brother; Diabetes in her unknown relative and unknown relative; Hypertension in her mother and unknown relative; Stroke in her father.   ROS:     Please see the history of present illness.    Occasional cough. All other systems reviewed and are negative.   PHYSICAL EXAM:   VS:  BP 106/70   Pulse 74   Ht 5\' 8"  (1.727 m)   Wt 145 lb 6.4 oz (66 kg)   BMI 22.11 kg/m    GEN: Well nourished, well developed, in no acute distress  HEENT: normal  Neck: no JVD, carotid bruits, or masses Cardiac: RRR; no murmurs, rubs, or gallops,no edema  Respiratory:  clear to auscultation bilaterally, normal work of breathing GI: soft, nontender, nondistended, + BS MS: no deformity or atrophy  Skin: warm and dry, no rash Neuro:  Alert and Oriented x 3, Strength and sensation are intact Psych: euthymic mood, full affect  Wt Readings from Last 3 Encounters:  08/26/17 145 lb 6.4 oz (66 kg)  05/17/16 141 lb 12.8 oz (64.3 kg)  05/16/15 141 lb 12.8 oz (64.3 kg)      Studies/Labs Reviewed:   EKG:  EKG sinus rhythm with left axis deviation/left anterior hemiblock.  Poor R wave progression.  No significant changes otherwise compared to 2018.  Recent Labs: No results found for requested labs within last 8760 hours.   Lipid Panel No results found for: CHOL, TRIG, HDL, CHOLHDL, VLDL, LDLCALC, LDLDIRECT  Additional studies/ records that were reviewed today include:  None    ASSESSMENT:    1. Atherosclerosis of native coronary artery of native heart with angina pectoris ([place])   2. Diabetes mellitus without complication ([place])   3. Other hyperlipidemia   4. Essential hypertension      PLAN:  In order of problems listed above:  1. Stable without angina.  Cardiac intervention 2004.  Continue to follow clinically.  Encouraged aerobic activity. 2. A1c less than 7 discussed. 3. LDL target should be less than 70.  Most recently it was 71 in November 2018.  Exercise and decrease saturated fat intake will help.  No change in medication regimen. 4. Target blood pressure 130/80 mmHg.  Salt restriction discussed.  Clinical follow-up in 1 year.   May consider functional testing on return.  She should call if any clinical concerns.    Medication Adjustments/Labs and Tests Ordered: Current medicines are reviewed at length with the patient today.  Concerns regarding medicines are outlined above.  Medication changes, Labs and Tests ordered today are listed in the Patient Instructions below. Patient Instructions  Medication Instructions:  No changes  Labwork: None ordered  Testing/Procedures: None ordered  Follow-Up: Your physician wants you to follow-up in: one year with Dr. Tamala Julian or someone  on his team. You will receive a reminder letter in the mail two months in advance. If you don't receive a letter, please call our office to schedule the follow-up appointment.   Any Other Special Instructions Will Be Listed Below (If Applicable).  Your physician encourages you to exercise for at least 30 minutes at least three times a week.      If you need a refill on your cardiac medications before your next appointment, please call your pharmacy.     Signed, Sinclair Grooms, MD  08/26/2017 6:56 PM    [facility] [place], [place], [place]  [address] Phone: [phone]; Fax: [phone]

## 2017-08-26 NOTE — Patient Instructions (Signed)
Medication Instructions:  No changes  Labwork: None ordered  Testing/Procedures: None ordered  Follow-Up: Your physician wants you to follow-up in: one year with Dr. Tamala Julian or someone on his team. You will receive a reminder letter in the mail two months in advance. If you don't receive a letter, please call our office to schedule the follow-up appointment.   Any Other Special Instructions Will Be Listed Below (If Applicable).  Your physician encourages you to exercise for at least 30 minutes at least three times a week.      If you need a refill on your cardiac medications before your next appointment, please call your pharmacy.

## 2018-02-08 ENCOUNTER — Other Ambulatory Visit: Payer: Self-pay | Admitting: Internal Medicine

## 2018-02-08 DIAGNOSIS — Z1231 Encounter for screening mammogram for malignant neoplasm of breast: Secondary | ICD-10-CM

## 2018-03-27 ENCOUNTER — Ambulatory Visit
Admission: RE | Admit: 2018-03-27 | Discharge: 2018-03-27 | Disposition: A | Discharge status: Home / Self Care | Payer: Medicare Other | Source: Ambulatory Visit | Attending: Internal Medicine | Admitting: Internal Medicine

## 2018-03-27 DIAGNOSIS — Z1231 Encounter for screening mammogram for malignant neoplasm of breast: Secondary | ICD-10-CM

## 2018-07-28 ENCOUNTER — Telehealth: Payer: Self-pay | Admitting: Interventional Cardiology

## 2018-07-28 NOTE — Telephone Encounter (Signed)
Patient set up for MyChart? Yes sent consent to mychart  Is patient using Smartphone/computer/tablet? yes  Did audio/video work?  Does patient need telephone visit?no  Best phone number to use? [phone]  Special Instructions? Patient does not have a scale or bp cuff, she will have medication list ready      Virtual Visit Pre-Appointment Phone Call  "(Name), I am calling you today to discuss your upcoming appointment. We are currently trying to limit exposure to the virus that causes COVID-19 by seeing patients at home rather than in the office."  1. "What is the BEST phone number to call the day of the visit?" - include this in appointment notes  2. Do you have or have access to (through a family member/friend) a smartphone with video capability that we can use for your visit?" a. If yes - list this number in appt notes as cell (if different from BEST phone #) and list the appointment type as a VIDEO visit in appointment notes b. If no - list the appointment type as a PHONE visit in appointment notes  3. Confirm consent - "In the setting of the current Covid19 crisis, you are scheduled for a (phone or video) visit with your provider on (date) at (time).  Just as we do with many in-office visits, in order for you to participate in this visit, we must obtain consent.  If you'd like, I can send this to your mychart (if signed up) or email for you to review.  Otherwise, I can obtain your verbal consent now.  All virtual visits are billed to your insurance company just like a normal visit would be.  By agreeing to a virtual visit, we'd like you to understand that the technology does not allow for your provider to perform an examination, and thus may limit your provider's ability to fully assess your condition. If your provider identifies any concerns that need to be evaluated in person, we will make arrangements to do so.  Finally, though the technology is pretty good, we cannot assure  that it will always work on either your or our end, and in the setting of a video visit, we may have to convert it to a phone-only visit.  In either situation, we cannot ensure that we have a secure connection.  Are you willing to proceed?" STAFF: Did the patient verbally acknowledge consent to telehealth visit? Document YES/NO here: yes  4. Advise patient to be prepared - "Two hours prior to your appointment, go ahead and check your blood pressure, pulse, oxygen saturation, and your weight (if you have the equipment to check those) and write them all down. When your visit starts, your provider will ask you for this information. If you have an Apple Watch or Kardia device, please plan to have heart rate information ready on the day of your appointment. Please have a pen and paper handy nearby the day of the visit as well."  5. Give patient instructions for MyChart download to smartphone OR Doximity/Doxy.me as below if video visit (depending on what platform provider is using)  6. Inform patient they will receive a phone call 15 minutes prior to their appointment time (may be from unknown caller ID) so they should be prepared to answer    TELEPHONE CALL NOTE  KRISANN MCKENNA has been deemed a candidate for a follow-up tele-health visit to limit community exposure during the Covid-19 pandemic. I spoke with the patient via phone to ensure availability of phone/video source,  confirm preferred email & phone number, and discuss instructions and expectations.  I reminded MOKSHA DORGAN to be prepared with any vital sign and/or heart rhythm information that could potentially be obtained via home monitoring, at the time of her visit. I reminded EZMA REHM to expect a phone call prior to her visit.  Howie Ill 07/28/2018 10:07 AM   INSTRUCTIONS FOR DOWNLOADING THE MYCHART APP TO SMARTPHONE  - The patient must first make sure to have activated MyChart and know their login information - If Apple, go  to [employer] and type in MyChart in the search bar and download the app. If Android, ask patient to go to [employer] and type in [place] in the search bar and download the app. The app is free but as with any other app downloads, their phone may require them to verify saved payment information or Apple/Android password.  - The patient will need to then log into the app with their MyChart username and password, and select [facility] as their healthcare provider to link the account. When it is time for your visit, go to the MyChart app, find appointments, and click Begin Video Visit. Be sure to Select Allow for your device to access the Microphone and Camera for your visit. You will then be connected, and your provider will be with you shortly.  **If they have any issues connecting, or need assistance please contact MyChart service desk [phone])**  **If using a computer, in order to ensure the best quality for their visit they will need to use either of the following Internet Browsers: [employer], or Google Chrome**  IF USING DOXIMITY or DOXY.ME - The patient will receive a link just prior to their visit by text.     FULL LENGTH CONSENT FOR TELE-HEALTH VISIT   I hereby voluntarily request, consent and authorize [facility] and its employed or contracted physicians, physician assistants, nurse practitioners or other licensed health care professionals (the Practitioner), to provide me with telemedicine health care services (the Services") as deemed necessary by the treating Practitioner. I acknowledge and consent to receive the Services by the Practitioner via telemedicine. I understand that the telemedicine visit will involve communicating with the Practitioner through live audiovisual communication technology and the disclosure of certain medical information by electronic transmission. I acknowledge that I have been given the opportunity to request an in-person  assessment or other available alternative prior to the telemedicine visit and am voluntarily participating in the telemedicine visit.  I understand that I have the right to withhold or withdraw my consent to the use of telemedicine in the course of my care at any time, without affecting my right to future care or treatment, and that the Practitioner or I may terminate the telemedicine visit at any time. I understand that I have the right to inspect all information obtained and/or recorded in the course of the telemedicine visit and may receive copies of available information for a reasonable fee.  I understand that some of the potential risks of receiving the Services via telemedicine include:   Delay or interruption in medical evaluation due to technological equipment failure or disruption;  Information transmitted may not be sufficient (e.g. poor resolution of images) to allow for appropriate medical decision making by the Practitioner; and/or   In rare instances, security protocols could fail, causing a breach of personal health information.  Furthermore, I acknowledge that it is my responsibility to provide information about my medical history,  conditions and care that is complete and accurate to the best of my ability. I acknowledge that Practitioner's advice, recommendations, and/or decision may be based on factors not within their control, such as incomplete or inaccurate data provided by me or distortions of diagnostic images or specimens that may result from electronic transmissions. I understand that the practice of medicine is not an exact science and that Practitioner makes no warranties or guarantees regarding treatment outcomes. I acknowledge that I will receive a copy of this consent concurrently upon execution via email to the email address I last provided but may also request a printed copy by calling the office of [facility].    I understand that my insurance will be billed for this  visit.   I have read or had this consent read to me.  I understand the contents of this consent, which adequately explains the benefits and risks of the Services being provided via telemedicine.   I have been provided ample opportunity to ask questions regarding this consent and the Services and have had my questions answered to my satisfaction.  I give my informed consent for the services to be provided through the use of telemedicine in my medical care  By participating in this telemedicine visit I agree to the above.

## 2018-07-31 ENCOUNTER — Ambulatory Visit: Payer: Medicare Other | Admitting: Interventional Cardiology

## 2018-08-02 NOTE — Progress Notes (Signed)
Virtual Visit via Video Note   This visit type was conducted due to national recommendations for restrictions regarding the COVID-19 Pandemic (e.g. social distancing) in an effort to limit this patient's exposure and mitigate transmission in our community.  Due to her co-morbid illnesses, this patient is at least at moderate risk for complications without adequate follow up.  This format is felt to be most appropriate for this patient at this time.  All issues noted in this document were discussed and addressed.  A limited physical exam was performed with this format.  Please refer to the patient's chart for her consent to telehealth for [facility].   Date:  08/02/2018   ID:  Mackenzie Gibbs, DOB 25-Jan-1941, MRN [id number]  Patient Location: Home Provider Location: Office  PCP:  Lavone Orn, MD  Cardiologist:  No primary care provider on file.  Electrophysiologist:  None   Evaluation Performed:  Follow-Up Visit  Chief Complaint:  CAd  History of Present Illness:    Mackenzie Gibbs is a 78 y.o. female with Diabetes type II with vascular complications,cerebrovascular disease, Hyperlipidemia,CAD with prior first-generation DES to circumflex and right coronary artery 2004,and essential hypertension.   Ms. Sandall is doing well.  She is not having angina.  She is relatively inactive.  Her last hemoglobin A1c was 6.8.  She is compliant with her medications.  She does not smoke.  The patient does not have symptoms concerning for COVID-19 infection (fever, chills, cough, or new shortness of breath).    Past Medical History:  Diagnosis Date  . Atherosclerotic heart disease native coronary artery w/angina pectoris ([place]) 2004   WITH RCA AND CIRCUMFLEX TAXUS DES  . Cerebrovascular disease   . Diabetes mellitus without complication ([place])   . Dry eyes   . Hyperlipidemia   . Hypertension   . Reflux   . 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 NO PAST SURGERIES       No outpatient medications have been marked as taking for the 08/03/18 encounter (Appointment) with Belva Crome, MD.     Allergies:   Dristan cold [chlorphen-pe-acetaminophen] and Kiwi extract   Social History   Tobacco Use  . Smoking status: Former Research scientist (life sciences)  . Smokeless tobacco: Never Used  Substance Use Topics  . Alcohol use: Yes    Alcohol/week: 0.0 standard drinks  . Drug use: No     Family Hx: The patient's family history includes Alcohol abuse in an other family member; Arthritis in an other family member; Breast cancer in an other family member; Breast cancer (age of onset: 66) in her mother; Coronary artery disease in her brother; Diabetes in some other family members; Hypertension in her mother and another family member; Stroke in her father.  ROS:   Please see the history of present illness.    No specific complaints other than decreased engagement in physical activity All other systems reviewed and are negative.   Prior CV studies:   The following studies were reviewed today:  No recent cardiac imaging or functional data  Labs/Other Tests and Data Reviewed:    EKG:  No ECG reviewed.  Recent Labs: No results found for requested labs within last 8760 hours.   Recent Lipid Panel No results found for: CHOL, TRIG, HDL, CHOLHDL, LDLCALC, LDLDIRECT  Wt Readings from Last 3 Encounters:  08/26/17 145 lb 6.4 oz (66 kg)  05/17/16 141 lb 12.8  oz (64.3 kg)  05/16/15 141 lb 12.8 oz (64.3 kg)     Objective:    Vital Signs:  There were no vitals taken for this visit.   VITAL SIGNS:  reviewed GEN:  no acute distress RESPIRATORY:  normal respiratory effort, symmetric expansion CARDIOVASCULAR:  no peripheral edema NEURO:  alert and oriented x 3, no obvious focal deficit  ASSESSMENT & PLAN:    1. Atherosclerosis of native coronary artery of native heart with angina pectoris ([place])   2. Other hyperlipidemia   3. Essential  hypertension   4. Diabetes mellitus without complication ([place])   5. Cerebrovascular disease   6. 2019 novel coronavirus disease (COVID-19)    PLAN:  1. Long discussion concerning secondary risk prevention as noted below.  I do believe she would be an excellent candidate to add an SGLT2 to provide additional cardioprotective effects.  We will discuss this with Dr. Lavone Orn. 2. LDL target should be less than 70.  Most recently it was 70.  She is on moderate intensity statin therapy.  We should consider increasing atorvastatin to 40 mg/day. 3. Blood pressure target 130/80 mmHg.  Low-salt, care with alcohol intake, and avoidance of nonsteroidal anti-inflammatory agents were discussed. 4. Target hemoglobin A1c should be less than 7.  As stated above it was 6.8 5. No new complaints  Overall education and awareness concerning primary/secondary risk prevention was discussed in detail: LDL less than 70, hemoglobin A1c less than 7, blood pressure target less than 130/80 mmHg, >150 minutes of moderate aerobic activity per week, avoidance of smoking, weight control (via diet and exercise), and continued surveillance/management of/for obstructive sleep apnea.    COVID-19 Education: The signs and symptoms of COVID-19 were discussed with the patient and how to seek care for testing (follow up with PCP or arrange E-visit).  The importance of social distancing was discussed today.  Time:   Today, I have spent 18 minutes with the patient with telehealth technology discussing the above problems.     Medication Adjustments/Labs and Tests Ordered: Current medicines are reviewed at length with the patient today.  Concerns regarding medicines are outlined above.   Tests Ordered: No orders of the defined types were placed in this encounter.   Medication Changes: No orders of the defined types were placed in this encounter.   Disposition:  Follow up in 1 year(s)  Signed, Sinclair Grooms, MD   08/02/2018 7:50 PM    [place]

## 2018-08-03 ENCOUNTER — Telehealth (INDEPENDENT_AMBULATORY_CARE_PROVIDER_SITE_OTHER): Payer: Medicare Other | Admitting: Interventional Cardiology

## 2018-08-03 ENCOUNTER — Other Ambulatory Visit: Payer: Self-pay

## 2018-08-03 ENCOUNTER — Encounter: Payer: Self-pay | Admitting: Interventional Cardiology

## 2018-08-03 VITALS — BP 136/82 | HR 80 | Ht 68.0 in | Wt 133.0 lb

## 2018-08-03 DIAGNOSIS — I679 Cerebrovascular disease, unspecified: Secondary | ICD-10-CM

## 2018-08-03 DIAGNOSIS — U071 COVID-19: Secondary | ICD-10-CM

## 2018-08-03 DIAGNOSIS — I10 Essential (primary) hypertension: Secondary | ICD-10-CM

## 2018-08-03 DIAGNOSIS — I25119 Atherosclerotic heart disease of native coronary artery with unspecified angina pectoris: Secondary | ICD-10-CM

## 2018-08-03 DIAGNOSIS — E119 Type 2 diabetes mellitus without complications: Secondary | ICD-10-CM

## 2018-08-03 DIAGNOSIS — E7849 Other hyperlipidemia: Secondary | ICD-10-CM

## 2018-08-03 NOTE — Patient Instructions (Signed)
Medication Instructions:  Your physician recommends that you continue on your current medications as directed. Please refer to the Current Medication list given to you today.  If you need a refill on your cardiac medications before your next appointment, please call your pharmacy.   Lab work: None If you have labs (blood work) drawn today and your tests are completely normal, you will receive your results only by: [name] MyChart Message (if you have MyChart) OR . A paper copy in the mail If you have any lab test that is abnormal or we need to change your treatment, we will call you to review the results.  Testing/Procedures: None  Follow-Up: At [facility], you and your health needs are our priority.  As part of our continuing mission to provide you with exceptional heart care, we have created designated Provider Care Teams.  These Care Teams include your primary Cardiologist (physician) and Advanced Practice Providers (APPs -  Physician Assistants and Nurse Practitioners) who all work together to provide you with the care you need, when you need it. You will need a follow up appointment in 12 months.  Please call our office 2 months in advance to schedule this appointment.  You may see Dr. Tamala Julian or one of the following Advanced Practice Providers on your designated Care Team:   Truitt Merle, NP Cecilie Kicks, NP . Kathyrn Drown, NP  Any Other Special Instructions Will Be Listed Below (If Applicable).  Your provider recommends that you maintain 150 minutes per week of moderate aerobic activity.

## 2019-02-06 ENCOUNTER — Other Ambulatory Visit: Payer: Self-pay

## 2019-02-06 DIAGNOSIS — Z20822 Contact with and (suspected) exposure to covid-19: Secondary | ICD-10-CM

## 2019-02-07 LAB — NOVEL CORONAVIRUS, NAA: SARS-CoV-2, NAA: NOT DETECTED

## 2019-02-13 ENCOUNTER — Other Ambulatory Visit: Payer: Self-pay

## 2019-02-13 DIAGNOSIS — Z20822 Contact with and (suspected) exposure to covid-19: Secondary | ICD-10-CM

## 2019-02-15 LAB — NOVEL CORONAVIRUS, NAA: SARS-CoV-2, NAA: NOT DETECTED

## 2019-02-27 ENCOUNTER — Other Ambulatory Visit: Payer: Self-pay | Admitting: Internal Medicine

## 2019-02-27 DIAGNOSIS — Z1231 Encounter for screening mammogram for malignant neoplasm of breast: Secondary | ICD-10-CM

## 2019-04-13 ENCOUNTER — Other Ambulatory Visit: Payer: Self-pay

## 2019-04-13 ENCOUNTER — Ambulatory Visit
Admission: RE | Admit: 2019-04-13 | Discharge: 2019-04-13 | Disposition: A | Discharge status: Home / Self Care | Payer: Medicare PPO | Source: Ambulatory Visit | Attending: Internal Medicine | Admitting: Internal Medicine

## 2019-04-13 DIAGNOSIS — Z1231 Encounter for screening mammogram for malignant neoplasm of breast: Secondary | ICD-10-CM

## 2019-04-17 ENCOUNTER — Other Ambulatory Visit: Payer: Self-pay | Admitting: Internal Medicine

## 2019-04-17 DIAGNOSIS — R928 Other abnormal and inconclusive findings on diagnostic imaging of breast: Secondary | ICD-10-CM

## 2019-04-19 ENCOUNTER — Other Ambulatory Visit: Payer: Self-pay | Admitting: Internal Medicine

## 2019-04-19 ENCOUNTER — Other Ambulatory Visit: Payer: Self-pay

## 2019-04-19 ENCOUNTER — Ambulatory Visit
Admission: RE | Admit: 2019-04-19 | Discharge: 2019-04-19 | Disposition: A | Discharge status: Home / Self Care | Payer: Medicare PPO | Source: Ambulatory Visit | Attending: Internal Medicine | Admitting: Internal Medicine

## 2019-04-19 DIAGNOSIS — N6310 Unspecified lump in the right breast, unspecified quadrant: Secondary | ICD-10-CM

## 2019-04-19 DIAGNOSIS — R928 Other abnormal and inconclusive findings on diagnostic imaging of breast: Secondary | ICD-10-CM

## 2019-04-20 ENCOUNTER — Ambulatory Visit
Admission: RE | Admit: 2019-04-20 | Discharge: 2019-04-20 | Disposition: A | Discharge status: Home / Self Care | Payer: Medicare PPO | Source: Ambulatory Visit | Attending: Internal Medicine | Admitting: Internal Medicine

## 2019-04-20 ENCOUNTER — Other Ambulatory Visit: Payer: Self-pay

## 2019-04-20 DIAGNOSIS — N6310 Unspecified lump in the right breast, unspecified quadrant: Secondary | ICD-10-CM

## 2019-04-25 ENCOUNTER — Encounter: Payer: Self-pay | Admitting: *Deleted

## 2019-04-25 ENCOUNTER — Other Ambulatory Visit: Payer: Self-pay | Admitting: *Deleted

## 2019-04-25 DIAGNOSIS — C50411 Malignant neoplasm of upper-outer quadrant of right female breast: Secondary | ICD-10-CM

## 2019-04-25 DIAGNOSIS — Z170 Estrogen receptor positive status [ER+]: Secondary | ICD-10-CM | POA: Insufficient documentation

## 2019-05-01 NOTE — Progress Notes (Signed)
[place]  [phone] Fax:([phone]) [phone]     ID: AVIANAH PELLMAN DOB: 03/22/1941  MR#: [id number]  EML#:[id number]  Patient Care Team: Lavone Orn, MD as PCP - General (Internal Medicine) Rockwell Germany, RN as Oncology Nurse Navigator Mauro Kaufmann, RN as Oncology Nurse Navigator Rolm Bookbinder, MD as Consulting Physician (General Surgery) Brenleigh Collet, Virgie Dad, MD as Consulting Physician (Oncology) Kyung Rudd, MD as Consulting Physician (Radiation Oncology) Belva Crome, MD as Consulting Physician (Cardiology) Jola Schmidt, MD as Consulting Physician (Ophthalmology) Chauncey Cruel, MD OTHER MD:  CHIEF COMPLAINT: estrogen receptor positive breast cancer  CURRENT TREATMENT: awaiting definitive surgery   HISTORY OF CURRENT ILLNESS: MAISHA BOGEN had routine screening mammography on 04/13/2019 showing a possible abnormality in the right breast. She underwent right diagnostic mammography with tomography and right breast ultrasonography at [facility] on 04/19/2019 showing: breast density category B; suspicious 0.6 cm right breast mass at 10 o'clock; no suspicious-appearing lymph nodes.  Accordingly on 04/20/2019 she proceeded to biopsy of the right breast area in question. The pathology from this procedure ([id number]) showed: invasive mammary carcinoma with lobular features, grade 2, e-cadherin weakly positive.  (This was presented as lobular on conference 05/02/2019).  Prognostic indicators significant for: estrogen receptor, 90% positive and progesterone receptor, 95% positive, both with strong staining intensity. Proliferation marker Ki67 at 25%. HER2 negative by immunohistochemistry (1+).  The patient's subsequent history is as detailed below.   INTERVAL HISTORY: Kiaria was evaluated in the multidisciplinary breast cancer clinic on 05/02/2019 accompanied by her daughter Ermalinda Memos, with her other two children present via speaker phone. Her case  was also presented at the multidisciplinary breast cancer conference on the same day. At that time a preliminary plan was proposed: Breast conserving surgery with sentinel lymph node sampling, likely no MRI, consider genetics, consider Oncotype, adjuvant radiation, antiestrogens   REVIEW OF SYSTEMS: Emberly reports on provider form poor appetite and arthritis. There were no specific symptoms leading to the original mammogram, which was routinely scheduled. The patient denies unusual headaches, visual changes, nausea, vomiting, stiff neck, dizziness, or gait imbalance. There has been no cough, phlegm production, or pleurisy, no chest pain or pressure, and no change in bowel or bladder habits. The patient denies fever, rash, bleeding, unexplained fatigue or unexplained weight loss.  She normally goes to the gym but during the pandemic she is not exercising regularly.  A detailed review of systems was otherwise entirely negative.   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 ([place]) 2004   WITH RCA AND CIRCUMFLEX TAXUS DES   Cerebrovascular disease    Diabetes mellitus without complication (HCC)    Dry eyes    Hyperlipidemia    Hypertension    Reflux    Stenosis of left vertebral artery    75%    PAST SURGICAL HISTORY: Past Surgical History:  Procedure Laterality Date   ABDOMINAL HYSTERECTOMY  1988   partial, per patient   cornary artery stents     2   TONSILLECTOMY      FAMILY HISTORY: Family History  Problem Relation Age of Onset   Stroke Father    Hypertension Mother    Breast cancer Mother 13   Diabetes Other    Hypertension Other    Arthritis Other    Breast cancer Other    Diabetes Other    Coronary artery disease Brother    Alcohol  abuse Other    Patient's father was 75 years old when he died from cerebral hemorrhage. Patient's mother died from breast cancer at  age 74. She was diagnosed at age 10. The patient denies a family hx of ovarian cancer. She does note a maternal aunt with "stomach" cancer. She has 1 brother.   GYNECOLOGIC HISTORY:  No LMP recorded. Patient is postmenopausal. Menarche: 79 years old Age at first live birth: 79 years old [place] P 3 LMP age 89 due to hysterectomy Contraceptive: never used HRT never used  Hysterectomy? Yes, for uterine fibroids BSO? 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 She is divorced. She lives at home by herself, with no pets son IJEOMA LOOR III, age 45, is an Sales executive in [place], [place]. Daughter Dr. Radene Ou, age 14, is a podiatrist in [place], [place]. Daughter Felizardo Hoffmann, age 2, is an event planner in [place]. The patient has two grandchildren and one great-grandchild. She attends [employer] DIRECTIVES: Not addressed   HEALTH MAINTENANCE: Social History   Tobacco Use   Smoking status: Former Smoker   Smokeless tobacco: Never Used  Substance Use Topics   Alcohol use: Yes    Alcohol/week: 0.0 standard drinks   Drug use: No     Colonoscopy: 2017  PAP: none on file  Bone density: date unknown   Allergies  Allergen Reactions   Dristan Cold [Chlorphen-Pe-Acetaminophen]     EYES PUFFY   Kiwi Extract     DEATHLY ILL    Current Outpatient Medications  Medication Sig Dispense Refill   aspirin EC 81 MG tablet Take 81 mg by mouth daily.     atorvastatin (LIPITOR) 20 MG tablet Take 20 mg by mouth daily.     beta carotene w/minerals (OCUVITE) tablet Take 1 tablet by mouth daily.     cycloSPORINE (RESTASIS) 0.05 % ophthalmic emulsion Place 1 drop into both eyes daily.      glucose blood test strip 1 each by Other route as needed for other. Use as instructed       Lancets 30G MISC by Does not apply route. ONE TOUCH ULTRA 250.00     lisinopril-hydrochlorothiazide (PRINZIDE,ZESTORETIC) 10-12.5 MG per tablet Take 1 tablet by mouth daily.     metFORMIN (GLUCOPHAGE) 500 MG tablet Take 1,000 mg by mouth 2 (two) times daily with a meal.      nitroGLYCERIN (NITROSTAT) 0.4 MG SL tablet Place 1 tablet (0.4 mg total) under the tongue every 5 (five) minutes as needed for chest pain. (Patient not taking: Reported on 05/02/2019) 25 tablet 1   No current facility-administered medications for this visit.    OBJECTIVE: Older African-American woman in no acute distress  Vitals:   05/02/19 1239  BP: 131/63  Pulse: 79  Resp: 18  Temp: 98 F (36.7 C)  SpO2: 97%     Body mass index is 21.62 kg/m.   Wt Readings from Last 3 Encounters:  05/02/19 142 lb 3.2 oz (64.5 kg)  08/03/18 133 lb (60.3 kg)  08/26/17 145 lb 6.4 oz (66 kg)      ECOG FS:1 - Symptomatic but completely ambulatory  Ocular: Sclerae unicteric, bilateral arcus senilis Ear-nose-throat: Wearing a mask Lymphatic: No cervical or supraclavicular adenopathy Lungs no rales or rhonchi  Heart regular rate and rhythm Abd soft, nontender, positive bowel sounds MSK no focal spinal tenderness, no joint edema Neuro: non-focal, well-oriented, appropriate affect Breasts: Right breast is status post recent biopsy.  The upper outer quadrant appears to me to be fairly diffusely indurated.  I do not palpate a well-defined mass however.  There is no erythema.  Left breast is benign.  Both axillae are benign.   LAB RESULTS:  CMP     Component Value Date/Time   NA 141 05/02/2019 1221   K 3.7 05/02/2019 1221   CL 103 05/02/2019 1221   CO2 29 05/02/2019 1221   GLUCOSE 186 (H) 05/02/2019 1221   BUN 16 05/02/2019 1221   CREATININE 0.93 05/02/2019 1221   CALCIUM 9.3 05/02/2019 1221   PROT 6.9 05/02/2019 1221   ALBUMIN 4.1 05/02/2019 1221   AST 14 (L) 05/02/2019 1221   ALT 17 05/02/2019 1221   ALKPHOS 62  05/02/2019 1221   BILITOT 0.3 05/02/2019 1221   GFRNONAA 59 (L) 05/02/2019 1221   GFRAA >60 05/02/2019 1221    No results found for: TOTALPROTELP, ALBUMINELP, A1GS, A2GS, BETS, BETA2SER, GAMS, MSPIKE, SPEI  Lab Results  Component Value Date   WBC 6.5 05/02/2019   NEUTROABS 3.7 05/02/2019   HGB 13.0 05/02/2019   HCT 41.1 05/02/2019   MCV 90.5 05/02/2019   PLT 238 05/02/2019    No results found for: LABCA2  No components found for: [id number]  No results for input(s): INR in the last 168 hours.  No results found for: LABCA2  No results found for: FOY774  No results found for: JOI786  No results found for: VEH209  No results found for: CA2729  No components found for: HGQUANT  No results found for: CEA1 / No results found for: CEA1   No results found for: AFPTUMOR  No results found for: CHROMOGRNA  No results found for: KPAFRELGTCHN, LAMBDASER, KAPLAMBRATIO (kappa/lambda light chains)  No results found for: HGBA, HGBA2QUANT, HGBFQUANT, HGBSQUAN (Hemoglobinopathy evaluation)   No results found for: LDH  No results found for: IRON, TIBC, IRONPCTSAT (Iron and TIBC)  No results found for: FERRITIN  Urinalysis No results found for: COLORURINE, APPEARANCEUR, LABSPEC, PHURINE, GLUCOSEU, HGBUR, BILIRUBINUR, KETONESUR, PROTEINUR, UROBILINOGEN, NITRITE, LEUKOCYTESUR   STUDIES: US BREAST LTD UNI RIGHT INC AXILLA  Result Date: 04/19/2019 CLINICAL DATA:  79 year old female presenting as a recall from screening for a right breast mass. EXAM: DIGITAL DIAGNOSTIC RIGHT MAMMOGRAM WITH TOMO ULTRASOUND RIGHT BREAST COMPARISON:  Previous exam(s). ACR Breast Density Category b: There are scattered areas of fibroglandular density. FINDINGS: Mammogram: Spot compression views were performed for the questioned mass in the upper outer right breast demonstrating persistence of a round mass measuring 0.6 cm. No new finding identified in the right breast. Ultrasound: Targeted ultrasound  is performed in the right breast at 10 o'clock 3 cm from the nipple demonstrating a round hypoechoic mass with indistinct margins measuring 0.6 x 0.5 x 0.5 cm. No internal blood flow identified. This corresponds to the mammographic finding. Targeted ultrasound of the right axilla demonstrates no suspicious appearing lymph nodes. IMPRESSION: Right breast mass at 10 o'clock measuring 0.6 cm is suspicious. RECOMMENDATION: Ultrasound-guided core needle biopsy of the right breast mass at 10 o'clock. I have discussed the findings and recommendations with the patient. If applicable, a reminder letter will be sent to the patient regarding the next appointment. BI-RADS CATEGORY  4: Suspicious. Electronically Signed   By: Audie Pinto M.D.   On: 04/19/2019 13:26  MM DIAG BREAST TOMO UNI RIGHT  Result Date: 04/19/2019 CLINICAL DATA:  79 year old female presenting as a recall from screening for a right breast mass. EXAM: DIGITAL DIAGNOSTIC RIGHT MAMMOGRAM WITH TOMO ULTRASOUND RIGHT BREAST COMPARISON:  Previous exam(s). ACR Breast Density Category b: There are scattered areas of fibroglandular density. FINDINGS: Mammogram: Spot compression views were performed for the questioned mass in the upper outer right breast demonstrating persistence of a round mass measuring 0.6 cm. No new finding identified in the right breast. Ultrasound: Targeted ultrasound is performed in the right breast at 10 o'clock 3 cm from the nipple demonstrating a round hypoechoic mass with indistinct margins measuring 0.6 x 0.5 x 0.5 cm. No internal blood flow identified. This corresponds to the mammographic finding. Targeted ultrasound of the right axilla demonstrates no suspicious appearing lymph nodes. IMPRESSION: Right breast mass at 10 o'clock measuring 0.6 cm is suspicious. RECOMMENDATION: Ultrasound-guided core needle biopsy of the right breast mass at 10 o'clock. I have discussed the findings and recommendations with the patient. If  applicable, a reminder letter will be sent to the patient regarding the next appointment. BI-RADS CATEGORY  4: Suspicious. Electronically Signed   By: Audie Pinto M.D.   On: 04/19/2019 13:26   MM 3D SCREEN BREAST BILATERAL  Result Date: 04/16/2019 CLINICAL DATA:  Screening. EXAM: DIGITAL SCREENING BILATERAL MAMMOGRAM WITH TOMO AND CAD COMPARISON:  Previous exam(s). ACR Breast Density Category b: There are scattered areas of fibroglandular density. FINDINGS: In the right breast, a possible mass warrants further evaluation. In the left breast, no findings suspicious for malignancy. Images were processed with CAD. IMPRESSION: Further evaluation is suggested for possible mass in the right breast. RECOMMENDATION: Diagnostic mammogram and possibly ultrasound of the right breast. (Code:[id number]) The patient will be contacted regarding the findings, and additional imaging will be scheduled. BI-RADS CATEGORY  0: Incomplete. Need additional imaging evaluation and/or prior mammograms for comparison. Electronically Signed   By: Kristopher Oppenheim M.D.   On: 04/16/2019 13:46   MM CLIP PLACEMENT RIGHT  Result Date: 04/20/2019 CLINICAL DATA:  Status post ultrasound-guided core needle biopsy of a 6 mm mass in the 10 o'clock position of the right breast. EXAM: DIAGNOSTIC RIGHT MAMMOGRAM POST ULTRASOUND BIOPSY COMPARISON:  Previous exam(s). FINDINGS: Mammographic images were obtained following ultrasound guided biopsy of the recently demonstrated 6 mm mass in the 10 o'clock position of the right breast. The biopsy marking clip is in expected position at the site of biopsy. IMPRESSION: Appropriate positioning of the ribbon shaped biopsy marking clip at the site of biopsy in the biopsied mass in the 10 o'clock position of the right breast. Final Assessment: Post Procedure Mammograms for Marker Placement Electronically Signed   By: Claudie Revering M.D.   On: 04/20/2019 13:36   [place] RT BREAST BX W LOC DEV 1ST LESION IMG BX SPEC US  GUIDE  Addendum Date: 04/24/2019   ADDENDUM REPORT: 04/24/2019 08:34 ADDENDUM: Pathology revealed GRADE II INVASIVE MAMMARY CARCINOMA WITH LOBULAR FEATURES of the RIGHT breast, 10 o'clock. This was found to be concordant by Dr. Claudie Revering. Pathology results were discussed with the patient by telephone. The patient reported doing well after the biopsy with tenderness at the site. Post biopsy instructions and care were reviewed and questions were answered. The patient was encouraged to call [facility] for any additional concerns. The patient was referred to [facility] on May 02, 2019. Given Lobular histology,  bilateral breast MRI recommended for extent of disease. Pathology results reported by Stacie Acres RN on 04/24/2019. Electronically Signed   By: Claudie Revering M.D.   On: 04/24/2019 08:34   Result Date: 04/24/2019 CLINICAL DATA:  6 mm suspicious mass in the 10 o'clock position of the right breast at recent mammography and ultrasound. EXAM: ULTRASOUND GUIDED RIGHT BREAST CORE NEEDLE BIOPSY COMPARISON:  Previous exam(s). FINDINGS: I met with the patient and we discussed the procedure of ultrasound-guided biopsy, including benefits and alternatives. We discussed the high likelihood of a successful procedure. We discussed the risks of the procedure, including infection, bleeding, tissue injury, clip migration, and inadequate sampling. Informed written consent was given. The usual time-out protocol was performed immediately prior to the procedure. Lesion quadrant: Upper outer quadrant Using sterile technique and 1% Lidocaine as local anesthetic, under direct ultrasound visualization, a 12 gauge spring-loaded device was used to perform biopsy of the recently demonstrated 6 mm mass in the 10 o'clock position of the right breast using a caudal approach. At the conclusion of the procedure ribbon shaped tissue  marker clip was deployed into the biopsy cavity. Follow up 2 view mammogram was performed and dictated separately. IMPRESSION: Ultrasound guided biopsy of the recently demonstrated 6 mm mass in the 10 o'clock position of the right breast. No apparent complications. Electronically Signed: By: Claudie Revering M.D. On: 04/20/2019 13:20     ELIGIBLE FOR AVAILABLE RESEARCH PROTOCOL: no  ASSESSMENT: 79 y.o. [place] woman status post right breast upper outer quadrant biopsy 04/20/2019 for a clinical T1b N0, stage IA invasive lobular carcinoma (E-cadherin weakly positive), grade 2, estrogen and progesterone receptor positive, HER-2 not amplified, with an MIB-1 of 25%  (1) breast conserving surgery with sentinel lymph node sampling  (2) consider Oncotype depending on the size of the primary tumor  (3) adjuvant radiation as appropriate  (4) antiestrogens to follow the completion of local treatment  PLAN: I met today with Natajah to review her new diagnosis. Specifically we discussed the biology of her breast cancer, its diagnosis, staging, treatment  options and prognosis. We first reviewed the fact that cancer is not one disease but more than 100 different diseases and that it is important to keep them separate-- otherwise when friends and relatives discuss their own cancer experiences with [place] confusion can result. Similarly we explained that if breast cancer spreads to the bone or liver, the patient would not have bone cancer or liver cancer, but breast cancer in the bone and breast cancer in the liver: one cancer in three places-- not 3 different cancers which otherwise would have to be treated in 3 different ways.  We discussed the difference between local and systemic therapy. In terms of loco-regional treatment, lumpectomy plus radiation is equivalent to mastectomy as far as survival is concerned. For this reason, and because the cosmetic results are generally superior, we recommend breast conserving  surgery.   Next we went over the options for systemic therapy which are anti-estrogens, anti-HER-2 immunotherapy, and chemotherapy. Meggie does not meet criteria for anti-HER-2 immunotherapy. She is a good candidate for anti-estrogens.  The question of chemotherapy is more complicated. Chemotherapy is most effective in rapidly growing, aggressive tumors. It may not be effective in intermediate-grade, not rapidly growing growing cancers, like Amiylah 's.  In addition we generally do not obtain Oncotype on tumors less than a centimeter in patients who are over 70 and have a ER positive disease.  For that reason we are going to consider  an Oncotype depending on the definitive surgical sample.  I will not however that most lobular breast cancers in my experience do not come back with high Oncotype scores  The plan then is to start with surgery, then likely bypass chemotherapy and proceed to radiation unless the patient opts against radiation and decides only to take antiestrogens.  Daneisha has a good understanding of the overall plan. She agrees with it. She knows the goal of treatment in her case is cure. She will call with any problems that may develop before her next visit here.  Total encounter time 60 minutes.Chauncey Cruel, MD   05/02/2019 6:28 PM Medical Oncology and Hematology [facility] [place], [place] [address] Tel. [phone]    Fax. [phone]   This document serves as a record of services personally performed by Lurline Del, MD. It was created on his behalf by Wilburn Mylar, a trained medical scribe. The creation of this record is based on the scribe's personal observations and the provider's statements to them.   I, Lurline Del MD, have reviewed the above documentation for accuracy and completeness, and I agree with the above.    *Total Encounter Time as defined by the Centers for Medicare and Medicaid Services includes, in addition to  the face-to-face time of a patient visit (documented in the note above) non-face-to-face time: obtaining and reviewing outside history, ordering and reviewing medications, tests or procedures, care coordination (communications with other health care professionals or caregivers) and documentation in the medical record.

## 2019-05-02 ENCOUNTER — Inpatient Hospital Stay: Payer: Medicare PPO

## 2019-05-02 ENCOUNTER — Telehealth: Payer: Self-pay | Admitting: Interventional Cardiology

## 2019-05-02 ENCOUNTER — Encounter: Payer: Self-pay | Admitting: Oncology

## 2019-05-02 ENCOUNTER — Other Ambulatory Visit: Payer: Self-pay | Admitting: General Surgery

## 2019-05-02 ENCOUNTER — Ambulatory Visit
Admission: RE | Admit: 2019-05-02 | Discharge: 2019-05-02 | Disposition: A | Discharge status: Home / Self Care | Payer: Medicare PPO | Source: Ambulatory Visit | Attending: Radiation Oncology | Admitting: Radiation Oncology

## 2019-05-02 ENCOUNTER — Encounter: Payer: Self-pay | Admitting: Physical Therapy

## 2019-05-02 ENCOUNTER — Inpatient Hospital Stay: Payer: Medicare PPO | Attending: Oncology | Admitting: Oncology

## 2019-05-02 ENCOUNTER — Other Ambulatory Visit: Payer: Self-pay

## 2019-05-02 ENCOUNTER — Ambulatory Visit: Payer: Medicare PPO | Attending: General Surgery | Admitting: Physical Therapy

## 2019-05-02 VITALS — BP 131/63 | HR 79 | Temp 98.0°F | Resp 18 | Ht 68.0 in | Wt 142.2 lb

## 2019-05-02 DIAGNOSIS — Z170 Estrogen receptor positive status [ER+]: Secondary | ICD-10-CM | POA: Insufficient documentation

## 2019-05-02 DIAGNOSIS — C50411 Malignant neoplasm of upper-outer quadrant of right female breast: Secondary | ICD-10-CM

## 2019-05-02 DIAGNOSIS — I25119 Atherosclerotic heart disease of native coronary artery with unspecified angina pectoris: Secondary | ICD-10-CM | POA: Insufficient documentation

## 2019-05-02 DIAGNOSIS — E119 Type 2 diabetes mellitus without complications: Secondary | ICD-10-CM | POA: Insufficient documentation

## 2019-05-02 DIAGNOSIS — Z79899 Other long term (current) drug therapy: Secondary | ICD-10-CM | POA: Diagnosis not present

## 2019-05-02 DIAGNOSIS — I10 Essential (primary) hypertension: Secondary | ICD-10-CM | POA: Diagnosis not present

## 2019-05-02 DIAGNOSIS — E782 Mixed hyperlipidemia: Secondary | ICD-10-CM

## 2019-05-02 DIAGNOSIS — E785 Hyperlipidemia, unspecified: Secondary | ICD-10-CM | POA: Diagnosis not present

## 2019-05-02 DIAGNOSIS — R293 Abnormal posture: Secondary | ICD-10-CM

## 2019-05-02 DIAGNOSIS — I6502 Occlusion and stenosis of left vertebral artery: Secondary | ICD-10-CM

## 2019-05-02 LAB — CBC WITH DIFFERENTIAL (CANCER CENTER ONLY)
Abs Immature Granulocytes: 0.02 10*3/uL (ref 0.00–0.07)
Basophils Absolute: 0 10*3/uL (ref 0.0–0.1)
Basophils Relative: 1 %
Eosinophils Absolute: 0.3 10*3/uL (ref 0.0–0.5)
Eosinophils Relative: 5 %
HCT: 41.1 % (ref 36.0–46.0)
Hemoglobin: 13 g/dL (ref 12.0–15.0)
Immature Granulocytes: 0 %
Lymphocytes Relative: 32 %
Lymphs Abs: 2.1 10*3/uL (ref 0.7–4.0)
MCH: 28.6 pg (ref 26.0–34.0)
MCHC: 31.6 g/dL (ref 30.0–36.0)
MCV: 90.5 fL (ref 80.0–100.0)
Monocytes Absolute: 0.4 10*3/uL (ref 0.1–1.0)
Monocytes Relative: 6 %
Neutro Abs: 3.7 10*3/uL (ref 1.7–7.7)
Neutrophils Relative %: 56 %
Platelet Count: 238 10*3/uL (ref 150–400)
RBC: 4.54 MIL/uL (ref 3.87–5.11)
RDW: 14.3 % (ref 11.5–15.5)
WBC Count: 6.5 10*3/uL (ref 4.0–10.5)
nRBC: 0 % (ref 0.0–0.2)

## 2019-05-02 LAB — CMP (CANCER CENTER ONLY)
ALT: 17 U/L (ref 0–44)
AST: 14 U/L — ABNORMAL LOW (ref 15–41)
Albumin: 4.1 g/dL (ref 3.5–5.0)
Alkaline Phosphatase: 62 U/L (ref 38–126)
Anion gap: 9 (ref 5–15)
BUN: 16 mg/dL (ref 8–23)
CO2: 29 mmol/L (ref 22–32)
Calcium: 9.3 mg/dL (ref 8.9–10.3)
Chloride: 103 mmol/L (ref 98–111)
Creatinine: 0.93 mg/dL (ref 0.44–1.00)
GFR, Est AFR Am: >60 mL/min (ref >60)
GFR, Estimated: 59 mL/min — ABNORMAL LOW (ref >60)
Glucose, Bld: 186 mg/dL — ABNORMAL HIGH (ref 70–99)
Potassium: 3.7 mmol/L (ref 3.5–5.1)
Sodium: 141 mmol/L (ref 135–145)
Total Bilirubin: 0.3 mg/dL (ref 0.3–1.2)
Total Protein: 6.9 g/dL (ref 6.5–8.1)

## 2019-05-02 LAB — GENETIC SCREENING ORDER

## 2019-05-02 NOTE — Therapy (Signed)
[place], [place], [address] Phone: [phone]   Fax:  [phone]  Physical Therapy Evaluation  Patient Details  Name: Mackenzie Gibbs MRN: [id number] Date of Birth: November 04, 1940 Referring Provider (PT): Dr. Rolm Bookbinder   Encounter Date: 05/02/2019  PT End of Session - 05/02/19 1446    Visit Number  1    Number of Visits  2    Date for PT Re-Evaluation  06/27/19    PT Start Time  1408    PT Stop Time  1433    PT Time Calculation (min)  25 min    Activity Tolerance  Patient tolerated treatment well    Behavior During Therapy  [facility] for tasks assessed/performed       Past Medical History:  Diagnosis Date  . 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 Cerebrovascular disease   . Diabetes mellitus without complication ([place])   . Dry eyes   . Hyperlipidemia   . Hypertension   . Reflux   . Stenosis of left vertebral artery    75%    Past Surgical History:  Procedure Laterality Date  . ABDOMINAL HYSTERECTOMY     partial, per patient  . cornary artery stents     2  . TONSILLECTOMY      There were no vitals filed for this visit.   Subjective Assessment - 05/02/19 1439    Subjective  Patient reports she is here today to be seen by her medical team for her newly diagnosed right breast cancer.    Patient is accompained by:  Family member    Pertinent History  Patient was diagnosed on 04/13/2019 with right invasive lobular carcinoma breast cancer. It measures 6 mm and is located in the upper outer quadrant. It is ER/PR positive and HER2 negative with a Ki67 of 25%.    Patient Stated Goals  Reduce lymphedema risk and learn post op shoulder ROM HEP    Currently in Pain?  Yes    Pain Score  3     Pain Location  Back    Pain Orientation  Mid    Pain Descriptors / Indicators  Aching    Pain Type  Acute pain    Pain  Onset  1 to 4 weeks ago    Pain Frequency  Intermittent    Aggravating Factors   Pain began after having breast biopsy poissbly from positioning    Pain Relieving Factors  Reports pain comes and goes; worse with using arms and better at rest    Multiple Pain Sites  No         [facility] PT Assessment - 05/02/19 0001      Assessment   Medical Diagnosis  Right breast cancer    Referring Provider (PT)  Dr. Rolm Bookbinder    Onset Date/Surgical Date  04/13/19    Hand Dominance  Right    Prior Therapy  none      Precautions   Precautions  Other (comment)    Precaution Comments  active cancer      Restrictions   Weight Bearing Restrictions  No      Balance Screen   Has the patient fallen in the past 6 months  No    Has the patient had a decrease in activity level because of a fear of falling?   No    Is the patient reluctant  to leave their home because of a fear of falling?   No      Home Environment   Living Environment  Private residence    Living Arrangements  Alone    Available Help at Discharge  Family      Prior Function   Level of [place]  Retired    Leisure  She does not exercise      Cognition   Overall Cognitive Status  Within Functional Limits for tasks assessed      Posture/Postural Control   Posture/Postural Control  Postural limitations    Postural Limitations  Rounded Shoulders;Forward head      ROM / Strength   AROM / PROM / Strength  AROM;Strength      AROM   AROM Assessment Site  Shoulder;Cervical    Right/Left Shoulder  Right;Left    Right Shoulder Extension  55 Degrees    Right Shoulder Flexion  145 Degrees    Right Shoulder ABduction  148 Degrees    Right Shoulder Internal Rotation  77 Degrees    Right Shoulder External Rotation  64 Degrees    Left Shoulder Extension  58 Degrees    Left Shoulder Flexion  146 Degrees    Left Shoulder ABduction  150 Degrees    Left Shoulder Internal Rotation  50 Degrees    Left  Shoulder External Rotation  74 Degrees    Cervical Flexion  WNL    Cervical Extension  90% limited    Cervical - Right Side Bend  50% limited    Cervical - Left Side Bend  50% limited    Cervical - Right Rotation  50% limited    Cervical - Left Rotation  50% limited      Strength   Overall Strength  Within functional limits for tasks performed        LYMPHEDEMA/ONCOLOGY QUESTIONNAIRE - 05/02/19 1445      Type   Cancer Type  Right breast cancer      Lymphedema Assessments   Lymphedema Assessments  Upper extremities      Right Upper Extremity Lymphedema   10 cm Proximal to Olecranon Process  26.7 cm    Olecranon Process  23 cm    10 cm Proximal to Ulnar Styloid Process  19.4 cm    Just Proximal to Ulnar Styloid Process  14.5 cm    Across Hand at [employer]  18.5 cm    At [place] of 2nd Digit  6.2 cm      Left Upper Extremity Lymphedema   10 cm Proximal to Olecranon Process  25.7 cm    Olecranon Process  22.8 cm    10 cm Proximal to Ulnar Styloid Process  18.1 cm    Just Proximal to Ulnar Styloid Process  14.2 cm    Across Hand at [employer]  18.3 cm    At [place] of 2nd Digit  5.7 cm          Quick Dash - 05/02/19 0001    Open a tight or new jar  Mild difficulty    Do heavy household chores (wash walls, wash floors)  No difficulty    Carry a shopping bag or briefcase  No difficulty    Wash your back  No difficulty    Use a knife to cut food  No difficulty    Recreational activities in which you take some force or impact through your arm,  shoulder, or hand (golf, hammering, tennis)  No difficulty    During the past week, to what extent has your arm, shoulder or hand problem interfered with your normal social activities with family, friends, neighbors, or groups?  Not at all    During the past week, to what extent has your arm, shoulder or hand problem limited your work or other regular daily activities  Not at all    Arm, shoulder, or hand pain.  Mild    Tingling  (pins and needles) in your arm, shoulder, or hand  None    Difficulty Sleeping  No difficulty    DASH Score  4.55 %        Objective measurements completed on examination: See above findings.        Patient was instructed today in a home exercise program today for post op shoulder range of motion. These included active assist shoulder flexion in sitting, scapular retraction, wall walking with shoulder abduction, and hands behind head external rotation.  She was encouraged to do these twice a day, holding 3 seconds and repeating 5 times when permitted by her physician.          PT Education - 05/02/19 1446    Education Details  Lymphedema risk reduction and post op shoulder ROM HEP    Person(s) Educated  Patient;Child(ren)    Methods  Explanation;Demonstration    Comprehension  Verbalized understanding;Returned demonstration          PT Long Term Goals - 05/02/19 1449      PT LONG TERM GOAL #1   Title  Patient will demonstrate she has regained full shoulder ROM and function post operatively compared to baselines.    Time  8    Period  Weeks    Status  New    Target Date  06/27/19      Breast Clinic Goals - 05/02/19 1449      Patient will be able to verbalize understanding of pertinent lymphedema risk reduction practices relevant to her diagnosis specifically related to skin care.   Time  1    Period  Days    Status  Achieved      Patient will be able to return demonstrate and/or verbalize understanding of the post-op home exercise program related to regaining shoulder range of motion.   Time  1    Period  Days    Status  Achieved      Patient will be able to verbalize understanding of the importance of attending the postoperative After Breast Cancer Class for further lymphedema risk reduction education and therapeutic exercise.   Time  1    Period  Days    Status  Achieved            Plan - 05/02/19 1447    Clinical Impression Statement  Patient was  diagnosed on 04/13/2019 with right invasive lobular carcinoma breast cancer. It measures 6 mm and is located in the upper outer quadrant. It is ER/PR positive and HER2 negative with a Ki67 of 25%. Her multidisciplinary medical team prior to her assessments to determine a recommended treatment plan. She is planning to have a right lumpectomy and sentinel node biopsy followed by possible oncotype testing, radiation, and anti-estrogen therapy. She will benefit from post op PT to regain shoulder ROM and reduce lympehdema risk.    Stability/Clinical Decision Making  Stable/Uncomplicated    Clinical Decision Making  Low    Rehab Potential  Excellent  PT Frequency  --   Eval and 1 f/u visit   PT Treatment/Interventions  ADLs/Self Care Home Management;Therapeutic exercise;Patient/family education    PT Next Visit Plan  Will reassess 3-4 weeks post op to determine needs    PT Home Exercise Plan  Post op shoulder ROM HEP    Consulted and Agree with Plan of Care  Patient;Family member/caregiver    Family Member Consulted  Daughter with another daughter and son on the phone       Patient will benefit from skilled therapeutic intervention in order to improve the following deficits and impairments:  Decreased range of motion, Postural dysfunction, Impaired UE functional use, Pain, Decreased knowledge of precautions  Visit Diagnosis: Malignant neoplasm of upper-outer quadrant of right breast in female, estrogen receptor positive ([place]) - Plan: PT plan of care cert/re-cert  Abnormal posture - Plan: PT plan of care cert/re-cert   Patient will follow up at outpatient cancer rehab 3-4 weeks following surgery.  If the patient requires physical therapy at that time, a specific plan will be dictated and sent to the referring physician for approval. The patient was educated today on appropriate basic range of motion exercises to begin post operatively and the importance of attending the After Breast Cancer class  following surgery.  Patient was educated today on lymphedema risk reduction practices as it pertains to recommendations that will benefit the patient immediately following surgery.  She verbalized good understanding.      Problem List Patient Active Problem List   Diagnosis Date Noted  . Malignant neoplasm of upper-outer quadrant of right breast in female, estrogen receptor positive ([place]) 04/25/2019  . 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 Diabetes mellitus without complication ([place])   . Hypertension   . Hyperlipidemia   . Cerebrovascular disease   . Stenosis of left vertebral artery   . Reflux   . Dry eyes    Annia Friendly, [place] 05/02/19 2:51 PM  [place], [place], [address] Phone: [phone]   Fax:  [phone]  Name: Mackenzie Gibbs MRN: [id number] Date of Birth: 1940-07-28

## 2019-05-02 NOTE — Patient Instructions (Signed)

## 2019-05-02 NOTE — Progress Notes (Signed)
Radiation Oncology         [phone] ________________________________  Name: Mackenzie Gibbs        MRN: [id number]  Date of Service: 05/02/2019 DOB: October 18, 1940  EZ:[id number], Mackenzie Reichmann, MD  Mackenzie Bookbinder, MD     REFERRING PHYSICIAN: Rolm Bookbinder, MD   DIAGNOSIS: The encounter diagnosis was Malignant neoplasm of upper-outer quadrant of right breast in female, estrogen receptor positive ([place]).   HISTORY OF PRESENT ILLNESS: Mackenzie Gibbs is a 79 y.o. female seen in the multidisciplinary breast clinic for a new diagnosis of right breast cancer. The patient was noted to have screening detected mass in the right breast.  On diagnostic imaging in the upper outer quadrant at 10:00 the patient had a 6 x 5 x 5 Gibbs lesion without adenopathy of the axilla.  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  She comes today to discuss treatment recommendations for her cancer.    PREVIOUS RADIATION THERAPY: No   PAST MEDICAL HISTORY:  Past Medical History:  Diagnosis Date  . 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 Cerebrovascular disease   . Diabetes mellitus without complication ([place])   . Dry eyes   . Hyperlipidemia   . Hypertension   . Reflux   . Stenosis of left vertebral artery    75%       PAST SURGICAL HISTORY: Past Surgical History:  Procedure Laterality Date  . NO PAST SURGERIES       FAMILY HISTORY:  Family History  Problem Relation Age of Onset  . Stroke Father   . Hypertension Mother   . Breast cancer Mother 61  . Diabetes Other   . Hypertension Other   . Arthritis Other   . Breast cancer Other   . Diabetes Other   . Coronary artery disease Brother   . Alcohol abuse Other      SOCIAL HISTORY:  reports that she has quit smoking. She has never used smokeless tobacco. She  reports current alcohol use. She reports that she does not use drugs.  The patient is divorced and lives in [place]. 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 She enjoys traveling and is accompanied by her daughter.   ALLERGIES: [place]ristan cold [chlorphen-pe-acetaminophen] and Kiwi extract   MEDICATIONS:  Current Outpatient Medications  Medication Sig Dispense Refill  . aspirin EC 81 MG tablet Take 81 mg by mouth daily.    [name] atorvastatin (LIPITOR) 20 MG tablet Take 20 mg by mouth daily.    . beta carotene w/minerals (OCUVITE) tablet Take 1 tablet by mouth daily.    . cycloSPORINE (RESTASIS) 0.05 % ophthalmic emulsion Place 1 drop into both eyes daily.     [name] glucose blood test strip 1 each by Other route as needed for other. Use as instructed    . Lancets 30G MISC by Does not apply route. ONE TOUCH ULTRA 250.00    . lisinopril-hydrochlorothiazide (PRINZIDE,ZESTORETIC) 10-12.5 MG per tablet Take 1 tablet by mouth daily.    . metFORMIN (GLUCOPHAGE) 500 MG tablet Take 1,000 mg by mouth 2 (two) times daily with a meal.     . nitroGLYCERIN (NITROSTAT) 0.4 MG SL tablet Place 1 tablet (  0.4 mg total) under the tongue every 5 (five) minutes as needed for chest pain. 25 tablet 1   No current facility-administered medications for this visit.     REVIEW OF SYSTEMS: On review of systems, the patient reports that she is doing well overall. She denies any chest pain, shortness of breath, cough, fevers, chills, night sweats, unintended weight changes. She denies any bowel or bladder disturbances, and denies abdominal pain, nausea or vomiting. She denies any new musculoskeletal or joint aches or pains. A complete review of systems is obtained and is otherwise negative.     PHYSICAL EXAM:  Wt Readings from Last 3 Encounters:  08/03/18 133 lb (60.3 kg)  08/26/17 145 lb 6.4 oz (66 kg)  05/17/16 141 lb  12.8 oz (64.3 kg)   Temp Readings from Last 3 Encounters:  No data found for Temp   BP Readings from Last 3 Encounters:  08/03/18 136/82  08/26/17 106/70  05/17/16 100/64   Pulse Readings from Last 3 Encounters:  08/03/18 80  08/26/17 74  05/17/16 91    In general this is a well appearing African-American female in no acute distress.  She's alert and oriented x4 and appropriate throughout the examination. Cardiopulmonary assessment is negative for acute distress and she exhibits normal effort.  Bilateral breast exam is deferred    ECOG = 0  0 - Asymptomatic (Fully active, able to carry on all predisease activities without restriction)  1 - Symptomatic but completely ambulatory (Restricted in physically strenuous activity but ambulatory and able to carry out work of a light or sedentary nature. For example, light housework, office work)  2 - Symptomatic, <50% in bed during the day (Ambulatory and capable of all self care but unable to carry out any work activities. Up and about more than 50% of waking hours)  3 - Symptomatic, >50% in bed, but not bedbound (Capable of only limited self-care, confined to bed or chair 50% or more of waking hours)  4 - Bedbound (Completely disabled. Cannot carry on any self-care. Totally confined to bed or chair)  5 - Death   Mackenzie Gibbs, Creech RH, Tormey DC, et al. [phone]). "Toxicity and response criteria of the [facility]". [place] Oncol. 5 (6): 649-55    LABORATORY DATA:  No results found for: WBC, HGB, HCT, MCV, PLT No results found for: NA, K, CL, CO2 No results found for: ALT, AST, GGT, ALKPHOS, BILITOT    RADIOGRAPHY: US BREAST LTD UNI RIGHT INC AXILLA  Result Date: 04/19/2019 CLINICAL DATA:  79 year old female presenting as a recall from screening for a right breast mass. EXAM: DIGITAL DIAGNOSTIC RIGHT MAMMOGRAM WITH TOMO ULTRASOUND RIGHT BREAST COMPARISON:  Previous exam(s). ACR Breast Density Category b:  There are scattered areas of fibroglandular density. FINDINGS: Mammogram: Spot compression views were performed for the questioned mass in the upper outer right breast demonstrating persistence of a round mass measuring 0.6 cm. No new finding identified in the right breast. Ultrasound: Targeted ultrasound is performed in the right breast at 10 o'clock 3 cm from the nipple demonstrating a round hypoechoic mass with indistinct margins measuring 0.6 x 0.5 x 0.5 cm. No internal blood flow identified. This corresponds to the mammographic finding. Targeted ultrasound of the right axilla demonstrates no suspicious appearing lymph nodes. IMPRESSION: Right breast mass at 10 o'clock measuring 0.6 cm is suspicious. RECOMMENDATION: Ultrasound-guided core needle biopsy of the right breast mass at 10 o'clock. I have discussed the findings and recommendations  with the patient. If applicable, a reminder letter will be sent to the patient regarding the next appointment. BI-RADS CATEGORY  4: Suspicious. Electronically Signed   By: Audie Pinto M.D.   On: 04/19/2019 13:26   Gibbs DIAG BREAST TOMO UNI RIGHT  Result Date: 04/19/2019 CLINICAL DATA:  79 year old female presenting as a recall from screening for a right breast mass. EXAM: DIGITAL DIAGNOSTIC RIGHT MAMMOGRAM WITH TOMO ULTRASOUND RIGHT BREAST COMPARISON:  Previous exam(s). ACR Breast Density Category b: There are scattered areas of fibroglandular density. FINDINGS: Mammogram: Spot compression views were performed for the questioned mass in the upper outer right breast demonstrating persistence of a round mass measuring 0.6 cm. No new finding identified in the right breast. Ultrasound: Targeted ultrasound is performed in the right breast at 10 o'clock 3 cm from the nipple demonstrating a round hypoechoic mass with indistinct margins measuring 0.6 x 0.5 x 0.5 cm. No internal blood flow identified. This corresponds to the mammographic finding. Targeted ultrasound of the  right axilla demonstrates no suspicious appearing lymph nodes. IMPRESSION: Right breast mass at 10 o'clock measuring 0.6 cm is suspicious. RECOMMENDATION: Ultrasound-guided core needle biopsy of the right breast mass at 10 o'clock. I have discussed the findings and recommendations with the patient. If applicable, a reminder letter will be sent to the patient regarding the next appointment. BI-RADS CATEGORY  4: Suspicious. Electronically Signed   By: Audie Pinto M.D.   On: 04/19/2019 13:26   Gibbs 3D SCREEN BREAST BILATERAL  Result Date: 04/16/2019 CLINICAL DATA:  Screening. EXAM: DIGITAL SCREENING BILATERAL MAMMOGRAM WITH TOMO AND CAD COMPARISON:  Previous exam(s). ACR Breast Density Category b: There are scattered areas of fibroglandular density. FINDINGS: In the right breast, a possible mass warrants further evaluation. In the left breast, no findings suspicious for malignancy. Images were processed with CAD. IMPRESSION: Further evaluation is suggested for possible mass in the right breast. RECOMMENDATION: Diagnostic mammogram and possibly ultrasound of the right breast. (Code:[id number]) The patient will be contacted regarding the findings, and additional imaging will be scheduled. BI-RADS CATEGORY  0: Incomplete. Need additional imaging evaluation and/or prior mammograms for comparison. Electronically Signed   By: Kristopher Oppenheim M.D.   On: 04/16/2019 13:46   Gibbs CLIP PLACEMENT RIGHT  Result Date: 04/20/2019 CLINICAL DATA:  Status post ultrasound-guided core needle biopsy of a 6 Gibbs mass in the 10 o'clock position of the right breast. EXAM: DIAGNOSTIC RIGHT MAMMOGRAM POST ULTRASOUND BIOPSY COMPARISON:  Previous exam(s). FINDINGS: Mammographic images were obtained following ultrasound guided biopsy of the recently demonstrated 6 Gibbs mass in the 10 o'clock position of the right breast. The biopsy marking clip is in expected position at the site of biopsy. IMPRESSION: Appropriate positioning of the ribbon  shaped biopsy marking clip at the site of biopsy in the biopsied mass in the 10 o'clock position of the right breast. Final Assessment: Post Procedure Mammograms for Marker Placement Electronically Signed   By: Claudie Revering M.D.   On: 04/20/2019 13:36   [place] RT BREAST BX W LOC DEV 1ST LESION IMG BX SPEC US GUIDE  Addendum Date: 04/24/2019   ADDENDUM REPORT: 04/24/2019 08:34 ADDENDUM: Pathology revealed GRADE II INVASIVE MAMMARY CARCINOMA WITH LOBULAR FEATURES of the RIGHT breast, 10 o'clock. This was found to be concordant by Dr. Claudie Revering. Pathology results were discussed with the patient by telephone. The patient reported doing well after the biopsy with tenderness at the site. Post biopsy instructions and care were reviewed and questions were answered. The  patient was encouraged to call [facility] for any additional concerns. The patient was referred to [facility] on May 02, 2019. Given Lobular histology, bilateral breast MRI recommended for extent of disease. Pathology results reported by Stacie Acres RN on 04/24/2019. Electronically Signed   By: Claudie Revering M.D.   On: 04/24/2019 08:34   Result Date: 04/24/2019 CLINICAL DATA:  6 Gibbs suspicious mass in the 10 o'clock position of the right breast at recent mammography and ultrasound. EXAM: ULTRASOUND GUIDED RIGHT BREAST CORE NEEDLE BIOPSY COMPARISON:  Previous exam(s). FINDINGS: I met with the patient and we discussed the procedure of ultrasound-guided biopsy, including benefits and alternatives. We discussed the high likelihood of a successful procedure. We discussed the risks of the procedure, including infection, bleeding, tissue injury, clip migration, and inadequate sampling. Informed written consent was given. The usual time-out protocol was performed immediately prior to the procedure. Lesion quadrant: Upper outer quadrant Using sterile  technique and 1% Lidocaine as local anesthetic, under direct ultrasound visualization, a 12 gauge spring-loaded device was used to perform biopsy of the recently demonstrated 6 Gibbs mass in the 10 o'clock position of the right breast using a caudal approach. At the conclusion of the procedure ribbon shaped tissue marker clip was deployed into the biopsy cavity. Follow up 2 view mammogram was performed and dictated separately. IMPRESSION: Ultrasound guided biopsy of the recently demonstrated 6 Gibbs mass in the 10 o'clock position of the right breast. No apparent complications. Electronically Signed: By: Claudie Revering M.D. On: 04/20/2019 13:20       IMPRESSION/PLAN: 1. Stage IA, [id number], grade 2, ER/PR positive invasive lobular carcinoma of the right breast. Dr. Lisbeth Renshaw discusses the pathology findings and reviews the nature of right breast disease. The consensus from the breast conference includes breast conservation with lumpectomy with sentinel node biopsy. Depending on the size of the final tumor measurements rendered by pathology, the tumor may be tested for Oncotype Dx score to determine a role for systemic therapy. Provided that chemotherapy is not indicated, she may benefit from adjuvant radiotherapy depending on final results of her surgical resection.  She would be offered antiestrogen therapy also in the adjuvant setting. We discussed the risks, benefits, short, and long term effects of radiotherapy, and the patient may be interested depending on her final results of surgery. Dr. Lisbeth Renshaw discusses the delivery and logistics of radiotherapy and anticipates a course of 4-6 1/2 weeks of radiotherapy, leaning towards a 4-week course based on the current data. We will see her back about 2 weeks after surgery to discuss the simulation process and anticipate we starting radiotherapy about 4-6 weeks after surgery.    In a visit lasting 45  minutes, greater than 50% of the time was spent face to face discussing her  case, and coordinating the patient's care.  The above documentation reflects my direct findings during this shared patient visit. Please see the separate note by Dr. Lisbeth Renshaw on this date for the remainder of the patient's plan of care.    Carola Rhine, PAC

## 2019-05-02 NOTE — Telephone Encounter (Signed)
New Message     [facility] Pre-operative Risk Assessment    Request for surgical clearance:  1. What type of surgery is being performed? Right Breast Lumpectomy  2. When is this surgery scheduled? TBD (ASAP)  3. What type of clearance is required (medical clearance vs. Pharmacy clearance to hold med vs. Both)? Medical  4. Are there any medications that need to be held prior to surgery and how long? No  5. Practice name and name of physician performing surgery? Dr. Rolm Bookbinder   6. What is your office phone number [phone]   7.   What is your office fax number [phone]  8.   Anesthesia type (None, local, MAC, general) ? General   Jenkins Rouge 05/02/2019, 2:41 PM  _________________________________________________________________   (provider comments below)

## 2019-05-02 NOTE — Telephone Encounter (Signed)
Left message to call back and ask to speak with pre-op team. 

## 2019-05-03 ENCOUNTER — Telehealth: Payer: Self-pay | Admitting: Oncology

## 2019-05-03 ENCOUNTER — Encounter: Payer: Self-pay | Admitting: General Practice

## 2019-05-03 NOTE — Telephone Encounter (Signed)
I talk with patient regarding schedule  

## 2019-05-03 NOTE — Telephone Encounter (Signed)
   Primary Cardiologist: Sinclair Grooms, MD  Chart reviewed as part of pre-operative protocol coverage. Patient was last seen by Dr. Tamala Julian for a virtual visit in 07/2018 at which time she was doing well from a cardiac standpoint. I called and spoke with patient on 05/03/2019 at which time she reported no change since last visit. No chest pain, shortness of breath, palpitations, lightheadedness, dizziness, syncope, orthopnea, PND, edema. Able to complete >4.0 METS without any anginal symptoms.   Patient has not had an EKG since 07/2017. Do not want to delay lumpectomy given malignancy; however, patient states she does not think it will be until the end of next week. Therefore, would like to see if patient could come in for an EKG tomorrow or Monday. If no acute ST/T changes, patient would be at acceptable risk for procedure without any additional cardiovascular testing.  Pre-Op Covering: - Can you please help arrange a time for patient to come in for an EKG? Patient states she can come in tomorrow.  Darreld Mclean, PA-C 05/03/2019, 2:37 PM

## 2019-05-03 NOTE — Telephone Encounter (Signed)
I s/w the pt and she is agreeable to come in tomorrow for an EKG for pre op clearance. I will have EKG scanned in for pre op team to review. I will send FYI to surgeon Dr. Rolm Bookbinder.

## 2019-05-03 NOTE — Progress Notes (Signed)
[place] Psychosocial Distress Screening Spiritual Care  Met with Mackenzie Gibbs and her youngest daughter in [facility] to introduce Mackenzie Gibbs team/resources, reviewing distress screen per protocol.  The patient scored a 0 on the Psychosocial Distress Thermometer which indicates minimal distress. Also assessed for distress and other psychosocial needs.   ONCBCN DISTRESS SCREENING 05/03/2019  Screening Type Initial Screening  Distress experienced in past week (1-10) 0  Referral to support programs Yes   Mackenzie Gibbs is deliberately approaching her diagnosis and treatment with a positive attitude, using faith and perspective to cope and make meaning along the way. She reports good support from her daughters, one of whom is a physician and joined by phone. Provided empathic listening, normalization of wide-ranging feelings and experiences, and introduction to Mackenzie Gibbs team/programming resources as helpful meaning-making tools even when one is doing well.  Follow up needed: No. Per family, no other needs or concerns at this time, but they know to contact chaplain or other team member whenever needed/desired.   [place], [place], [facility] Pager [phone] Voicemail [phone]

## 2019-05-04 ENCOUNTER — Ambulatory Visit (INDEPENDENT_AMBULATORY_CARE_PROVIDER_SITE_OTHER): Payer: Medicare PPO | Admitting: *Deleted

## 2019-05-04 ENCOUNTER — Other Ambulatory Visit: Payer: Self-pay

## 2019-05-04 ENCOUNTER — Other Ambulatory Visit: Payer: Self-pay | Admitting: General Surgery

## 2019-05-04 VITALS — HR 72 | Ht 68.0 in | Wt 137.0 lb

## 2019-05-04 DIAGNOSIS — C50411 Malignant neoplasm of upper-outer quadrant of right female breast: Secondary | ICD-10-CM

## 2019-05-04 DIAGNOSIS — I25119 Atherosclerotic heart disease of native coronary artery with unspecified angina pectoris: Secondary | ICD-10-CM | POA: Diagnosis not present

## 2019-05-04 DIAGNOSIS — Z01818 Encounter for other preprocedural examination: Secondary | ICD-10-CM | POA: Diagnosis not present

## 2019-05-04 NOTE — Telephone Encounter (Signed)
   Primary Cardiologist: Sinclair Grooms, MD  Chart reviewed as part of pre-operative protocol coverage. Pt contacted by phone 02/04 and ECG done 02/05 was without acute changes. Therefore, based on ACC/AHA guidelines, Mackenzie Gibbs would be at acceptable risk for the planned procedure without further cardiovascular testing.   I will route this recommendation to the requesting party via Epic fax function and remove from pre-op pool.  Please call with questions.  Rosaria Ferries, PA-C 05/04/2019, 5:46 PM

## 2019-05-04 NOTE — Progress Notes (Signed)
EKG was taken to the DOD Dr. Burt Knack for review. Will have EKG scanned and have pre op team review for clearance for the pt. Pt does state her surgery is now scheduled for 05/17/19.

## 2019-05-07 ENCOUNTER — Telehealth: Payer: Self-pay

## 2019-05-07 NOTE — Telephone Encounter (Signed)
NOTES ON FILE FROM [facility] [phone], SENT REFERRAL TO SCHEDULING

## 2019-05-07 NOTE — Telephone Encounter (Signed)
NOTES ON FILE FROM [facility] [phone], SENT REFERRAL TO SCHEDULING °

## 2019-05-09 ENCOUNTER — Telehealth: Payer: Self-pay

## 2019-05-09 NOTE — Telephone Encounter (Signed)
Nutrition  Patient identified by attended Breast Clinic on 05/02/19.   Chart reviewed.  Called patient to introduce self and service at [facility].  No answer or option to leave voicemail as mailbox full.  Patient was given nutrition packet at visit on 05/02/19 by nurse navigator.  Patient has contact information.  Please refer patient to RD if services needed.  Dameian Crisman B. Zenia Resides, [place], [place] Registered Dietitian [phone] (pager)

## 2019-05-10 ENCOUNTER — Other Ambulatory Visit: Payer: Self-pay

## 2019-05-10 ENCOUNTER — Encounter (HOSPITAL_BASED_OUTPATIENT_CLINIC_OR_DEPARTMENT_OTHER): Payer: Self-pay | Admitting: General Surgery

## 2019-05-10 ENCOUNTER — Ambulatory Visit: Payer: Medicare PPO | Attending: Family

## 2019-05-10 ENCOUNTER — Telehealth: Payer: Self-pay | Admitting: *Deleted

## 2019-05-10 DIAGNOSIS — Z23 Encounter for immunization: Secondary | ICD-10-CM

## 2019-05-10 NOTE — Telephone Encounter (Signed)
Spoke with patient to follow up from [facility] and assess navigation needs.  Patient denies any concerns or questions at this time.  Encouraged her to call if anything arises.

## 2019-05-10 NOTE — Progress Notes (Signed)
   Covid-19 Vaccination Clinic  Name:  Mackenzie Gibbs    MRN: [id number] DOB: [birth year]  05/10/2019  Mackenzie Gibbs was observed post Covid-19 immunization for 15 minutes without incidence. She was provided with Vaccine Information Sheet and instruction to access the V-Safe system.   Mackenzie Gibbs was instructed to call 911 with any severe reactions post vaccine: [name] Difficulty breathing  . Swelling of your face and throat  . A fast heartbeat  . A bad rash all over your body  . Dizziness and weakness

## 2019-05-14 ENCOUNTER — Other Ambulatory Visit (HOSPITAL_COMMUNITY)
Admission: RE | Admit: 2019-05-14 | Discharge: 2019-05-14 | Disposition: A | Discharge status: Home / Self Care | Payer: Medicare PPO | Source: Ambulatory Visit | Attending: General Surgery | Admitting: General Surgery

## 2019-05-14 ENCOUNTER — Encounter (HOSPITAL_BASED_OUTPATIENT_CLINIC_OR_DEPARTMENT_OTHER)
Admission: RE | Admit: 2019-05-14 | Discharge: 2019-05-14 | Disposition: A | Discharge status: Home / Self Care | Payer: Medicare PPO | Source: Ambulatory Visit | Attending: General Surgery | Admitting: General Surgery

## 2019-05-14 ENCOUNTER — Other Ambulatory Visit: Payer: Self-pay

## 2019-05-14 DIAGNOSIS — Z01812 Encounter for preprocedural laboratory examination: Secondary | ICD-10-CM | POA: Insufficient documentation

## 2019-05-14 DIAGNOSIS — Z20822 Contact with and (suspected) exposure to covid-19: Secondary | ICD-10-CM | POA: Diagnosis not present

## 2019-05-14 LAB — BASIC METABOLIC PANEL
Anion gap: 13 (ref 5–15)
BUN: 12 mg/dL (ref 8–23)
CO2: 27 mmol/L (ref 22–32)
Calcium: 9.7 mg/dL (ref 8.9–10.3)
Chloride: 101 mmol/L (ref 98–111)
Creatinine, Ser: 0.87 mg/dL (ref 0.44–1.00)
GFR calc Af Amer: >60 mL/min (ref >60)
GFR calc non Af Amer: >60 mL/min (ref >60)
Glucose, Bld: 147 mg/dL — ABNORMAL HIGH (ref 70–99)
Potassium: 3.9 mmol/L (ref 3.5–5.1)
Sodium: 141 mmol/L (ref 135–145)

## 2019-05-14 LAB — SARS CORONAVIRUS 2 (TAT 6-24 HRS): SARS Coronavirus 2: NEGATIVE

## 2019-05-14 MED ORDER — ENSURE PRE-SURGERY PO LIQD: 296.0000 mL | Freq: Once | ORAL | Status: DC

## 2019-05-14 NOTE — Progress Notes (Signed)

## 2019-05-15 ENCOUNTER — Telehealth: Payer: Self-pay | Admitting: *Deleted

## 2019-05-15 NOTE — Telephone Encounter (Signed)
-----   Message from Lonn Georgia, PA-C sent at 05/13/2019 11:14 AM EST ----- Okay to hold aspirin for 3 days ----- Message ----- From: Reola Mosher Sent: 05/12/2019   7:20 AM EST To: Belva Crome, MD, Lonn Georgia, PA-C, #  Dr Tamala Julian, Please address if ok for Ms Kendal to hold ASA for 3 days prior to breast lumpectomy (scheduled for 02/18).   79 y.o. female with  Diabetes type II with vascular complications, cerebrovascular disease,  Hyperlipidemia, CAD with prior first-generation DES to circumflex and right coronary artery 2004, and essential hypertension.  You saw her 08/03/2018 televisit and she was doing well at that time.   See phone notes starting 02/03 regarding cardiac clearance.  Thank you RGB  ----- Message ----- From: Joline Salt, RN Sent: 05/10/2019   2:22 PM EST To: Lonn Georgia, PA-C  Thank you for the clearance, spoke with pt and just need to know if she may hold her Asprin for 3 days prior to surgery. Thank you. Adah Perl Adm Testing RN [phone]

## 2019-05-15 NOTE — Telephone Encounter (Signed)
S/w Claiborne Billings from Dr.Wakefield's office to address ASA to be held for 3 days prior to surgery. Faxed message # [phone] message via epic.

## 2019-05-16 ENCOUNTER — Inpatient Hospital Stay: Admission: RE | Admit: 2019-05-16 | Payer: Medicare PPO | Source: Ambulatory Visit

## 2019-05-17 ENCOUNTER — Encounter (HOSPITAL_COMMUNITY): Payer: Medicare PPO

## 2019-05-17 DIAGNOSIS — Z01812 Encounter for preprocedural laboratory examination: Secondary | ICD-10-CM | POA: Diagnosis not present

## 2019-05-18 ENCOUNTER — Encounter (HOSPITAL_BASED_OUTPATIENT_CLINIC_OR_DEPARTMENT_OTHER): Payer: Self-pay | Admitting: General Surgery

## 2019-05-21 ENCOUNTER — Other Ambulatory Visit (HOSPITAL_COMMUNITY)
Admission: RE | Admit: 2019-05-21 | Discharge: 2019-05-21 | Disposition: A | Discharge status: Home / Self Care | Payer: Medicare PPO | Source: Ambulatory Visit | Attending: General Surgery | Admitting: General Surgery

## 2019-05-21 DIAGNOSIS — Z20822 Contact with and (suspected) exposure to covid-19: Secondary | ICD-10-CM | POA: Insufficient documentation

## 2019-05-21 DIAGNOSIS — Z01812 Encounter for preprocedural laboratory examination: Secondary | ICD-10-CM | POA: Insufficient documentation

## 2019-05-21 LAB — SARS CORONAVIRUS 2 (TAT 6-24 HRS): SARS Coronavirus 2: NEGATIVE

## 2019-05-22 ENCOUNTER — Telehealth: Payer: Self-pay | Admitting: Interventional Cardiology

## 2019-05-22 ENCOUNTER — Encounter: Payer: Self-pay | Admitting: *Deleted

## 2019-05-22 NOTE — Telephone Encounter (Signed)
New Message    Pt c/o medication issue:  1. Name of Medication: Anastrozole   2. How are you currently taking this medication (dosage and times per day)? Not yet taking   3. Are you having a reaction (difficulty breathing--STAT)? No   4. What is your medication issue? Pts daughter is calling and says the pt is being prescribed this medication to start taking after a procedure. She wants to make sure this is okay for her to take     Please call

## 2019-05-22 NOTE — Telephone Encounter (Signed)
Will route to Dr. Tamala Julian and Pharmacy team for review.

## 2019-05-23 ENCOUNTER — Ambulatory Visit
Admission: RE | Admit: 2019-05-23 | Discharge: 2019-05-23 | Disposition: A | Discharge status: Home / Self Care | Payer: Medicare PPO | Source: Ambulatory Visit | Attending: General Surgery | Admitting: General Surgery

## 2019-05-23 ENCOUNTER — Other Ambulatory Visit: Payer: Self-pay

## 2019-05-23 DIAGNOSIS — C50411 Malignant neoplasm of upper-outer quadrant of right female breast: Secondary | ICD-10-CM

## 2019-05-23 NOTE — Telephone Encounter (Signed)
There are no contraindications with anastrozole and her other meds, however more common cardiac side effects that she should be aware of include angina, hypertension, and vasodilation (could cause flushing or lightheadedness).

## 2019-05-24 ENCOUNTER — Ambulatory Visit (HOSPITAL_BASED_OUTPATIENT_CLINIC_OR_DEPARTMENT_OTHER)
Admission: RE | Admit: 2019-05-24 | Discharge: 2019-05-24 | Disposition: A | Discharge status: Home / Self Care | Payer: Medicare PPO | Attending: General Surgery | Admitting: General Surgery

## 2019-05-24 ENCOUNTER — Ambulatory Visit (HOSPITAL_BASED_OUTPATIENT_CLINIC_OR_DEPARTMENT_OTHER): Payer: Medicare PPO | Admitting: Certified Registered"

## 2019-05-24 ENCOUNTER — Other Ambulatory Visit: Payer: Self-pay

## 2019-05-24 ENCOUNTER — Ambulatory Visit
Admission: RE | Admit: 2019-05-24 | Discharge: 2019-05-24 | Disposition: A | Discharge status: Home / Self Care | Payer: Medicare PPO | Source: Ambulatory Visit | Attending: General Surgery | Admitting: General Surgery

## 2019-05-24 ENCOUNTER — Encounter (HOSPITAL_BASED_OUTPATIENT_CLINIC_OR_DEPARTMENT_OTHER): Payer: Self-pay | Admitting: General Surgery

## 2019-05-24 ENCOUNTER — Encounter (HOSPITAL_BASED_OUTPATIENT_CLINIC_OR_DEPARTMENT_OTHER)
Admission: RE | Disposition: A | Discharge status: Home / Self Care | Payer: Self-pay | Source: Home / Self Care | Attending: General Surgery

## 2019-05-24 ENCOUNTER — Encounter (HOSPITAL_COMMUNITY)
Admission: RE | Admit: 2019-05-24 | Discharge: 2019-05-24 | Disposition: A | Discharge status: Home / Self Care | Payer: Medicare PPO | Source: Ambulatory Visit | Attending: General Surgery | Admitting: General Surgery

## 2019-05-24 DIAGNOSIS — Z7984 Long term (current) use of oral hypoglycemic drugs: Secondary | ICD-10-CM | POA: Insufficient documentation

## 2019-05-24 DIAGNOSIS — Z803 Family history of malignant neoplasm of breast: Secondary | ICD-10-CM | POA: Insufficient documentation

## 2019-05-24 DIAGNOSIS — E119 Type 2 diabetes mellitus without complications: Secondary | ICD-10-CM | POA: Diagnosis not present

## 2019-05-24 DIAGNOSIS — C50411 Malignant neoplasm of upper-outer quadrant of right female breast: Secondary | ICD-10-CM | POA: Diagnosis present

## 2019-05-24 DIAGNOSIS — Z955 Presence of coronary angioplasty implant and graft: Secondary | ICD-10-CM | POA: Diagnosis not present

## 2019-05-24 DIAGNOSIS — Z170 Estrogen receptor positive status [ER+]: Secondary | ICD-10-CM | POA: Diagnosis not present

## 2019-05-24 DIAGNOSIS — I2510 Atherosclerotic heart disease of native coronary artery without angina pectoris: Secondary | ICD-10-CM | POA: Diagnosis not present

## 2019-05-24 HISTORY — PX: BREAST LUMPECTOMY WITH RADIOACTIVE SEED AND SENTINEL LYMPH NODE BIOPSY: SHX6550

## 2019-05-24 LAB — GLUCOSE, CAPILLARY
Glucose-Capillary: 153 mg/dL — ABNORMAL HIGH (ref 70–99)
Glucose-Capillary: 117 mg/dL — ABNORMAL HIGH (ref 70–99)

## 2019-05-24 SURGERY — BREAST LUMPECTOMY WITH RADIOACTIVE SEED AND SENTINEL LYMPH NODE BIOPSY
Anesthesia: General | Site: Breast | Laterality: Right

## 2019-05-24 MED ORDER — OXYCODONE HCL 5 MG/5ML PO SOLN: 5.0000 mg | Freq: Once | ORAL | Status: DC | PRN

## 2019-05-24 MED ORDER — GABAPENTIN 100 MG PO CAPS
100.0000 mg | ORAL_CAPSULE | ORAL | Status: AC
  Administered 2019-05-24: 100 mg via ORAL

## 2019-05-24 MED ORDER — PROPOFOL 10 MG/ML IV BOLUS
INTRAVENOUS | Status: DC | PRN
  Administered 2019-05-24: 100 mg via INTRAVENOUS

## 2019-05-24 MED ORDER — FENTANYL CITRATE (PF) 100 MCG/2ML IJ SOLN
INTRAMUSCULAR | Status: DC | PRN
  Administered 2019-05-24: 50 ug via INTRAVENOUS

## 2019-05-24 MED ORDER — ACETAMINOPHEN 500 MG PO TABS
ORAL_TABLET | ORAL | Status: AC
  Filled 2019-05-24: qty 2

## 2019-05-24 MED ORDER — CEFAZOLIN SODIUM-DEXTROSE 2-4 GM/100ML-% IV SOLN
INTRAVENOUS | Status: AC
  Filled 2019-05-24: qty 100

## 2019-05-24 MED ORDER — FENTANYL CITRATE (PF) 100 MCG/2ML IJ SOLN
INTRAMUSCULAR | Status: AC
  Filled 2019-05-24: qty 2

## 2019-05-24 MED ORDER — PHENYLEPHRINE HCL-NACL 10-0.9 MG/250ML-% IV SOLN
INTRAVENOUS | Status: DC | PRN
  Administered 2019-05-24: 40 ug/min via INTRAVENOUS

## 2019-05-24 MED ORDER — LACTATED RINGERS IV SOLN: INTRAVENOUS | Status: DC

## 2019-05-24 MED ORDER — DEXAMETHASONE SODIUM PHOSPHATE 4 MG/ML IJ SOLN
INTRAMUSCULAR | Status: DC | PRN
  Administered 2019-05-24: 5 mg via INTRAVENOUS

## 2019-05-24 MED ORDER — 0.9 % SODIUM CHLORIDE (POUR BTL) OPTIME
TOPICAL | Status: DC | PRN
  Administered 2019-05-24: 1000 mL

## 2019-05-24 MED ORDER — FENTANYL CITRATE (PF) 100 MCG/2ML IJ SOLN: 25.0000 ug | INTRAMUSCULAR | Status: DC | PRN

## 2019-05-24 MED ORDER — LIDOCAINE HCL (CARDIAC) PF 100 MG/5ML IV SOSY
PREFILLED_SYRINGE | INTRAVENOUS | Status: DC | PRN
  Administered 2019-05-24: 30 mg via INTRAVENOUS

## 2019-05-24 MED ORDER — ACETAMINOPHEN 160 MG/5ML PO SOLN: 325.0000 mg | Freq: Once | ORAL | Status: DC | PRN

## 2019-05-24 MED ORDER — BUPIVACAINE HCL (PF) 0.25 % IJ SOLN
INTRAMUSCULAR | Status: DC | PRN
  Administered 2019-05-24: 8 mL

## 2019-05-24 MED ORDER — ACETAMINOPHEN 500 MG PO TABS
1000.0000 mg | ORAL_TABLET | ORAL | Status: AC
  Administered 2019-05-24: 1000 mg via ORAL

## 2019-05-24 MED ORDER — OXYCODONE HCL 5 MG PO TABS: 5.0000 mg | ORAL_TABLET | Freq: Once | ORAL | Status: DC | PRN

## 2019-05-24 MED ORDER — GABAPENTIN 100 MG PO CAPS
ORAL_CAPSULE | ORAL | Status: AC
  Filled 2019-05-24: qty 1

## 2019-05-24 MED ORDER — MIDAZOLAM HCL 2 MG/2ML IJ SOLN
INTRAMUSCULAR | Status: AC
  Filled 2019-05-24: qty 2

## 2019-05-24 MED ORDER — ACETAMINOPHEN 10 MG/ML IV SOLN: 1000.0000 mg | Freq: Once | INTRAVENOUS | Status: DC | PRN

## 2019-05-24 MED ORDER — ACETAMINOPHEN 325 MG PO TABS: 325.0000 mg | ORAL_TABLET | Freq: Once | ORAL | Status: DC | PRN

## 2019-05-24 MED ORDER — TRAMADOL HCL 50 MG PO TABS: 100.0000 mg | ORAL_TABLET | Freq: Four times a day (QID) | ORAL | 0 refills | Status: DC | PRN

## 2019-05-24 MED ORDER — PROPOFOL 500 MG/50ML IV EMUL
INTRAVENOUS | Status: DC | PRN
  Administered 2019-05-24: 25 ug/kg/min via INTRAVENOUS

## 2019-05-24 MED ORDER — BUPIVACAINE-EPINEPHRINE (PF) 0.5% -1:200000 IJ SOLN
INTRAMUSCULAR | Status: DC | PRN
  Administered 2019-05-24: 30 mL via PERINEURAL

## 2019-05-24 MED ORDER — FENTANYL CITRATE (PF) 100 MCG/2ML IJ SOLN
50.0000 ug | INTRAMUSCULAR | Status: DC | PRN
  Administered 2019-05-24 (×2): 25 ug via INTRAVENOUS

## 2019-05-24 MED ORDER — ONDANSETRON HCL 4 MG/2ML IJ SOLN
INTRAMUSCULAR | Status: DC | PRN
  Administered 2019-05-24: 4 mg via INTRAVENOUS

## 2019-05-24 MED ORDER — PROMETHAZINE HCL 25 MG/ML IJ SOLN: 6.2500 mg | INTRAMUSCULAR | Status: DC | PRN

## 2019-05-24 MED ORDER — TECHNETIUM TC 99M SULFUR COLLOID FILTERED
1.0000 | Freq: Once | INTRAVENOUS | Status: AC | PRN
  Administered 2019-05-24: 09:00:00 1 via INTRADERMAL

## 2019-05-24 MED ORDER — CEFAZOLIN SODIUM-DEXTROSE 2-4 GM/100ML-% IV SOLN
2.0000 g | INTRAVENOUS | Status: AC
  Administered 2019-05-24: 08:00:00 2 g via INTRAVENOUS

## 2019-05-24 MED ORDER — MIDAZOLAM HCL 2 MG/2ML IJ SOLN
1.0000 mg | INTRAMUSCULAR | Status: DC | PRN
  Administered 2019-05-24: 1 mg via INTRAVENOUS

## 2019-05-24 SURGICAL SUPPLY — 61 items
ADH SKN CLS APL DERMABOND .7 (GAUZE/BANDAGES/DRESSINGS) ×1
APL PRP STRL LF DISP 70% ISPRP (MISCELLANEOUS) ×1
APPLIER CLIP 9.375 MED OPEN (MISCELLANEOUS)
APR CLP MED 9.3 20 MLT OPN (MISCELLANEOUS)
BINDER BREAST LRG (GAUZE/BANDAGES/DRESSINGS) ×2 IMPLANT
BINDER BREAST MEDIUM (GAUZE/BANDAGES/DRESSINGS) IMPLANT
BINDER BREAST XLRG (GAUZE/BANDAGES/DRESSINGS) IMPLANT
BINDER BREAST XXLRG (GAUZE/BANDAGES/DRESSINGS) IMPLANT
BLADE SURG 15 STRL LF DISP TIS (BLADE) ×1 IMPLANT
BLADE SURG 15 STRL SS (BLADE) ×6
CANISTER SUC SOCK COL 7IN (MISCELLANEOUS) IMPLANT
CANISTER SUCT 1200ML W/VALVE (MISCELLANEOUS) IMPLANT
CHLORAPREP W/TINT 26 (MISCELLANEOUS) ×3 IMPLANT
CLIP APPLIE 9.375 MED OPEN (MISCELLANEOUS) IMPLANT
CLIP VESOCCLUDE SM WIDE 6/CT (CLIP) ×3 IMPLANT
CLOSURE WOUND 1/2 X4 (GAUZE/BANDAGES/DRESSINGS) ×1
COVER BACK TABLE 60X90IN (DRAPES) ×3 IMPLANT
COVER MAYO STAND STRL (DRAPES) ×3 IMPLANT
COVER PROBE W GEL 5X96 (DRAPES) ×3 IMPLANT
COVER WAND RF STERILE (DRAPES) IMPLANT
DECANTER SPIKE VIAL GLASS SM (MISCELLANEOUS) IMPLANT
DERMABOND ADVANCED (GAUZE/BANDAGES/DRESSINGS) ×2
DERMABOND ADVANCED .7 DNX12 (GAUZE/BANDAGES/DRESSINGS) ×1 IMPLANT
DRAPE LAPAROSCOPIC ABDOMINAL (DRAPES) ×3 IMPLANT
DRAPE UTILITY XL STRL (DRAPES) ×3 IMPLANT
ELECT COATED BLADE 2.86 ST (ELECTRODE) ×3 IMPLANT
ELECT REM PT RETURN 9FT ADLT (ELECTROSURGICAL) ×3
ELECTRODE REM PT RTRN 9FT ADLT (ELECTROSURGICAL) ×1 IMPLANT
GLOVE BIO SURGEON STRL SZ7 (GLOVE) ×6 IMPLANT
GLOVE BIOGEL PI IND STRL 7.5 (GLOVE) ×1 IMPLANT
GLOVE BIOGEL PI INDICATOR 7.5 (GLOVE) ×2
GOWN STRL REUS W/ TWL LRG LVL3 (GOWN DISPOSABLE) ×2 IMPLANT
GOWN STRL REUS W/TWL LRG LVL3 (GOWN DISPOSABLE) ×6
HEMOSTAT ARISTA ABSORB 3G PWDR (HEMOSTASIS) IMPLANT
KIT MARKER MARGIN INK (KITS) ×3 IMPLANT
NDL HYPO 25X1 1.5 SAFETY (NEEDLE) ×1 IMPLANT
NDL SAFETY ECLIPSE 18X1.5 (NEEDLE) IMPLANT
NEEDLE HYPO 18GX1.5 SHARP (NEEDLE)
NEEDLE HYPO 25X1 1.5 SAFETY (NEEDLE) ×3 IMPLANT
NS IRRIG 1000ML POUR BTL (IV SOLUTION) IMPLANT
PACK BASIN DAY SURGERY FS (CUSTOM PROCEDURE TRAY) ×3 IMPLANT
PENCIL SMOKE EVACUATOR (MISCELLANEOUS) ×3 IMPLANT
RETRACTOR ONETRAX LX 90X20 (MISCELLANEOUS) IMPLANT
SLEEVE SCD COMPRESS KNEE MED (MISCELLANEOUS) ×3 IMPLANT
SPONGE LAP 4X18 RFD (DISPOSABLE) ×3 IMPLANT
STRIP CLOSURE SKIN 1/2X4 (GAUZE/BANDAGES/DRESSINGS) ×2 IMPLANT
SUT ETHILON 2 0 FS 18 (SUTURE) IMPLANT
SUT MNCRL AB 4-0 PS2 18 (SUTURE) ×3 IMPLANT
SUT MON AB 5-0 PS2 18 (SUTURE) ×2 IMPLANT
SUT SILK 2 0 SH (SUTURE) IMPLANT
SUT VIC AB 2-0 SH 27 (SUTURE) ×6
SUT VIC AB 2-0 SH 27XBRD (SUTURE) ×1 IMPLANT
SUT VIC AB 3-0 SH 27 (SUTURE) ×6
SUT VIC AB 3-0 SH 27X BRD (SUTURE) ×1 IMPLANT
SUT VIC AB 5-0 PS2 18 (SUTURE) IMPLANT
SYR CONTROL 10ML LL (SYRINGE) ×3 IMPLANT
TOWEL GREEN STERILE FF (TOWEL DISPOSABLE) ×3 IMPLANT
TRAY FAXITRON CT DISP (TRAY / TRAY PROCEDURE) ×3 IMPLANT
TUBE CONNECTING 20'X1/4 (TUBING)
TUBE CONNECTING 20X1/4 (TUBING) IMPLANT
YANKAUER SUCT BULB TIP NO VENT (SUCTIONS) IMPLANT

## 2019-05-24 NOTE — Progress Notes (Signed)
Nuc med inj performed by nuc med staff. Pt tol procedure well with additional fentanyl given for comfort. Emotional support provided. Will update daughter in lobby.

## 2019-05-24 NOTE — H&P (Signed)
79 yof referred by [name] for new right breast cancer. she had no mass or dc. she had screening detected breast mass. has b density breasts. she had small uoq mass. US shows a 6x5x5 mm mass. 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 she is here with her daughter to discuss options. two other children are on phone. she has history cardiac stents over 15 years ago and no issues now. Sees Dr Tamala Julian for this.  she has fh in her mom age 46 with bca treated with lumpectomy and later recurrence requiring mastectomy  Past Surgical History Tawni Pummel, RN; 05/02/2019 7:31 AM) Breast Biopsy  Right. Hysterectomy (not due to cancer) - Partial   Diagnostic Studies History Tawni Pummel, RN; 05/02/2019 7:31 AM) Colonoscopy  1-5 years ago Mammogram  within last year Pap Smear  >5 years ago  Medication History Tawni Pummel, RN; 05/02/2019 7:31 AM) Medications Reconciled  Social History Tawni Pummel, RN; 05/02/2019 7:31 AM) Alcohol use  Occasional alcohol use. Caffeine use  Carbonated beverages, Coffee, Tea. Tobacco use  Never smoker.  Family History Tawni Pummel, RN; 05/02/2019 7:31 AM) Alcohol Abuse  Brother. Arthritis  Mother. Breast Cancer  Mother. Cerebrovascular Accident  Mother. Diabetes Mellitus  Father. Heart Disease  Mother. Hypertension  Mother.  Pregnancy / Birth History Tawni Pummel, RN; 05/02/2019 7:31 AM) Age at menarche  30 years. Age of menopause  58-50 Gravida  79 Maternal age  38-30 Para  3 Regular periods   Other Problems Tawni Pummel, RN; 05/02/2019 7:31 AM) Diabetes Mellitus  Gastroesophageal Reflux Disease  High blood pressure  Hypercholesterolemia  Transfusion history    Review of Systems Sunday Spillers Ledford RN; 05/02/2019 7:31 AM) General Not Present- Appetite Loss, Chills, Fatigue, Fever, Night Sweats, Weight Gain and Weight Loss. Skin Not Present- Change in Wart/Mole,  Dryness, Hives, Jaundice, New Lesions, Non-Healing Wounds, Rash and Ulcer. HEENT Present- Seasonal Allergies and Wears glasses/contact lenses. Not Present- Earache, Hearing Loss, Hoarseness, Nose Bleed, Oral Ulcers, Ringing in the Ears, Sinus Pain, Sore Throat, Visual Disturbances and Yellow Eyes. Respiratory Not Present- Bloody sputum, Chronic Cough, Difficulty Breathing, Snoring and Wheezing. Breast Not Present- Breast Mass, Breast Pain, Nipple Discharge and Skin Changes. Cardiovascular Not Present- Chest Pain, Difficulty Breathing Lying Down, Leg Cramps, Palpitations, Rapid Heart Rate, Shortness of Breath and Swelling of Extremities. Gastrointestinal Not Present- Abdominal Pain, Bloating, Bloody Stool, Change in Bowel Habits, Chronic diarrhea, Constipation, Difficulty Swallowing, Excessive gas, Gets full quickly at meals, Hemorrhoids, Indigestion, Nausea, Rectal Pain and Vomiting. Female Genitourinary Not Present- Frequency, Nocturia, Painful Urination, Pelvic Pain and Urgency. Musculoskeletal Not Present- Back Pain, Joint Pain, Joint Stiffness, Muscle Pain, Muscle Weakness and Swelling of Extremities. Neurological Not Present- Decreased Memory, Fainting, Headaches, Numbness, Seizures, Tingling, Tremor, Trouble walking and Weakness. Psychiatric Not Present- Anxiety, Bipolar, Change in Sleep Pattern, Depression, Fearful and Frequent crying. Endocrine Not Present- Cold Intolerance, Excessive Hunger, Hair Changes, Heat Intolerance, Hot flashes and New Diabetes. Hematology Not Present- Blood Thinners, Easy Bruising, Excessive bleeding, Gland problems, HIV and Persistent Infections.   Physical Exam Rolm Bookbinder MD; 05/02/2019 2:21 PM) General Mental Status-Alert. Orientation-Oriented X3. Head and Neck Trachea-midline. Eye Sclera/Conjunctiva - Bilateral-No scleral icterus. Chest and Lung Exam Chest and lung exam reveals -quiet, even and easy respiratory effort with no use of  accessory muscles. Breast Nipples-No Discharge. Breast Lump-No Palpable Breast Mass. Abdomen Note: soft nontender Neurologic Neurologic evaluation reveals -alert and oriented x 3 with no  impairment of recent or remote memory. Lymphatic Head & Neck General Head & Neck Lymphatics: Bilateral - Description - Normal. Axillary General Axillary Region: Bilateral - Description - Normal. Note: no Dawson adenopathy   Assessment & Plan Rolm Bookbinder MD; 05/02/2019 2:25 PM) BREAST CANCER OF UPPER-OUTER QUADRANT OF RIGHT FEMALE BREAST (C50.411) Story: Right breast seed guided lumpectomy, right axillary sn biopsy We discussed the staging and pathophysiology of breast cancer. We discussed all of the different options for treatment for breast cancer including surgery, chemotherapy, radiation therapy, and antiestrogen therapy. We discussed a sentinel lymph node biopsy as she does not appear to having lymph node involvement right now. We discussed the performance of that with injection of radioactive tracer. We discussed that there is a chance of having a positive node with a sentinel lymph node biopsy and we will await the permanent pathology to make any other first further decisions in terms of her treatment. We discussed up to a 5% risk lifetime of chronic shoulder pain as well as lymphedema associated with a sentinel lymph node biopsy. We discussed the options for treatment of the breast cancer which included lumpectomy versus a mastectomy. We discussed the performance of the lumpectomy with radioactive seed placement. We discussed a 5-10% chance of a positive margin requiring reexcision in the operating room. We also discussed that she will might need radiation therapy if she undergoes lumpectomy. The breast cannot undergo more radiation therapy in the same breast after lumpectomy in the future. We discussed mastectomy and the postoperative care for that as well. Mastectomy can be followed by  reconstruction. The decision for lumpectomy vs mastectomy has no impact on decision for chemotherapy. Most mastectomy patients will not need radiation therapy. We discussed that there is no difference in her survival whether she undergoes lumpectomy with radiation therapy or antiestrogen therapy versus a mastectomy. There is also no real difference between her recurrence in the breast. We discussed the risks of operation including bleeding, infection, possible reoperation. She understands her further therapy will be based on what her stages at the time of her operation.

## 2019-05-24 NOTE — Anesthesia Procedure Notes (Signed)
Procedure Name: LMA Insertion Date/Time: 05/24/2019 7:38 AM Performed by: Navaeh Kehres, Ernesta Amble, CRNA Pre-anesthesia Checklist: Patient identified, Emergency Drugs available, Suction available and Patient being monitored Patient Re-evaluated:Patient Re-evaluated prior to induction Oxygen Delivery Method: Circle system utilized Preoxygenation: Pre-oxygenation with 100% oxygen Induction Type: IV induction Ventilation: Mask ventilation without difficulty LMA: LMA inserted LMA Size: 4.0 Number of attempts: 1 Airway Equipment and Method: Bite block Placement Confirmation: positive ETCO2 Tube secured with: Tape Dental Injury: Teeth and Oropharynx as per pre-operative assessment

## 2019-05-24 NOTE — Discharge Instructions (Signed)
[facility] Office Phone Number [phone]  BREAST BIOPSY/ PARTIAL MASTECTOMY: POST OP INSTRUCTIONS Take 400 mg of ibuprofen every 8 hours or 650 mg tylenol every 6 hours for next 72 hours then as needed. Use ice several times daily also. Always review your discharge instruction sheet given to you by the facility where your surgery was performed.  IF YOU HAVE DISABILITY OR FAMILY LEAVE FORMS, YOU MUST BRING THEM TO THE OFFICE FOR PROCESSING.  DO NOT GIVE THEM TO YOUR DOCTOR.  1. A prescription for pain medication may be given to you upon discharge.  Take your pain medication as prescribed, if needed.  If narcotic pain medicine is not needed, then you may take acetaminophen (Tylenol), naprosyn (Alleve) or ibuprofen (Advil) as needed. 2. Take your usually prescribed medications unless otherwise directed 3. If you need a refill on your pain medication, please contact your pharmacy.  They will contact our office to request authorization.  Prescriptions will not be filled after 5pm or on week-ends. 4. You should eat very light the first 24 hours after surgery, such as soup, crackers, pudding, etc.  Resume your normal diet the day after surgery. 5. Most patients will experience some swelling and bruising in the breast.  Ice packs and a good support bra will help.  Wear the breast binder provided or a sports bra for 72 hours day and night.  After that wear a sports bra during the day until you return to the office. Swelling and bruising can take several days to resolve.  6. It is common to experience some constipation if taking pain medication after surgery.  Increasing fluid intake and taking a stool softener will usually help or prevent this problem from occurring.  A mild laxative (Milk of Magnesia or Miralax) should be taken according to package directions if there are no bowel movements after 48 hours. 7. Unless discharge instructions indicate otherwise, you may remove your bandages 48  hours after surgery and you may shower at that time.  You may have steri-strips (small skin tapes) in place directly over the incision.  These strips should be left on the skin for 7-10 days and will come off on their own.  If your surgeon used skin glue on the incision, you may shower in 24 hours.  The glue will flake off over the next 2-3 weeks.  Any sutures or staples will be removed at the office during your follow-up visit. 8. ACTIVITIES:  You may resume regular daily activities (gradually increasing) beginning the next day.  Wearing a good support bra or sports bra minimizes pain and swelling.  You may have sexual intercourse when it is comfortable. a. You may drive when you no longer are taking prescription pain medication, you can comfortably wear a seatbelt, and you can safely maneuver your car and apply brakes. b. RETURN TO WORK:  ______________________________________________________________________________________ 9. You should see your doctor in the office for a follow-up appointment approximately two weeks after your surgery.  Your doctor's nurse will typically make your follow-up appointment when she calls you with your pathology report.  Expect your pathology report 3-4 business days after your surgery.  You may call to check if you do not hear from us after three days. 10. OTHER INSTRUCTIONS: _______________________________________________________________________________________________ _____________________________________________________________________________________________________________________________________ _____________________________________________________________________________________________________________________________________ _____________________________________________________________________________________________________________________________________  WHEN TO CALL DR WAKEFIELD: 1. Fever over 101.0 2. Nausea and/or vomiting. 3. Extreme swelling or  bruising. 4. Continued bleeding from incision. 5. Increased pain, redness, or drainage from the incision.  The clinic   staff is available to answer your questions during regular business hours.  Please don't hesitate to call and ask to speak to one of the nurses for clinical concerns.  If you have a medical emergency, go to the nearest emergency room or call 911.  A surgeon from [facility] is always on call at the hospital.  For further questions, please visit [email or web address]  Next dose of Tylenol can be given at 1pm, today if needed   Post Anesthesia Home Care Instructions  Activity: Get plenty of rest for the remainder of the day. A responsible individual must stay with you for 24 hours following the procedure.  For the next 24 hours, DO NOT: -Drive a car -Paediatric nurse -Drink alcoholic beverages -Take any medication unless instructed by your physician -Make any legal decisions or sign important papers.  Meals: Start with liquid foods such as gelatin or soup. Progress to regular foods as tolerated. Avoid greasy, spicy, heavy foods. If nausea and/or vomiting occur, drink only clear liquids until the nausea and/or vomiting subsides. Call your physician if vomiting continues.  Special Instructions/Symptoms: Your throat may feel dry or sore from the anesthesia or the breathing tube placed in your throat during surgery. If this causes discomfort, gargle with warm salt water. The discomfort should disappear within 24 hours.  If you had a scopolamine patch placed behind your ear for the management of post- operative nausea and/or vomiting:  1. The medication in the patch is effective for 72 hours, after which it should be removed.  Wrap patch in a tissue and discard in the trash. Wash hands thoroughly with soap and water. 2. You may remove the patch earlier than 72 hours if you experience unpleasant side effects which may include dry mouth, dizziness or  visual disturbances. 3. Avoid touching the patch. Wash your hands with soap and water after contact with the patch.    Regional Anesthesia Blocks  1. Numbness or the inability to move the "blocked" extremity may last from 3-48 hours after placement. The length of time depends on the medication injected and your individual response to the medication. If the numbness is not going away after 48 hours, call your surgeon.  2. The extremity that is blocked will need to be protected until the numbness is gone and the  Strength has returned. Because you cannot feel it, you will need to take extra care to avoid injury. Because it may be weak, you may have difficulty moving it or using it. You may not know what position it is in without looking at it while the block is in effect.  3. For blocks in the legs and feet, returning to weight bearing and walking needs to be done carefully. You will need to wait until the numbness is entirely gone and the strength has returned. You should be able to move your leg and foot normally before you try and bear weight or walk. You will need someone to be with you when you first try to ensure you do not fall and possibly risk injury.  4. Bruising and tenderness at the needle site are common side effects and will resolve in a few days.  5. Persistent numbness or new problems with movement should be communicated to the surgeon or the [facility] [phone] [place] [id number]).

## 2019-05-24 NOTE — Anesthesia Preprocedure Evaluation (Addendum)
Anesthesia Evaluation  Patient identified by MRN, date of birth, ID band Patient awake    Reviewed: Allergy & Precautions, NPO status , Patient's Chart, lab work & pertinent test results  Airway Mallampati: II  TM Distance: >3 FB Neck ROM: Full    Dental  (+) Teeth Intact, Dental Advisory Given   Pulmonary former smoker,    breath sounds clear to auscultation       Cardiovascular hypertension, Pt. on medications + CAD   Rhythm:Regular Rate:Normal     Neuro/Psych negative neurological ROS  negative psych ROS   GI/Hepatic negative GI ROS, Neg liver ROS,   Endo/Other  diabetes, Type 2, Oral Hypoglycemic Agents  Renal/GU      Musculoskeletal negative musculoskeletal ROS (+)   Abdominal Normal abdominal exam  (+)   Peds  Hematology negative hematology ROS (+)   Anesthesia Other Findings   Reproductive/Obstetrics                            Anesthesia Physical Anesthesia Plan  ASA: II  Anesthesia Plan: General   Post-op Pain Management: GA combined w/ Regional for post-op pain   Induction: Intravenous  PONV Risk Score and Plan: 4 or greater and Ondansetron, Treatment may vary due to age or medical condition and Dexamethasone  Airway Management Planned: LMA  Additional Equipment: None  Intra-op Plan:   Post-operative Plan: Extubation in OR  Informed Consent: I have reviewed the patients History and Physical, chart, labs and discussed the procedure including the risks, benefits and alternatives for the proposed anesthesia with the patient or authorized representative who has indicated his/her understanding and acceptance.     Dental advisory given  Plan Discussed with: CRNA  Anesthesia Plan Comments: (Covid-19 Nucleic Acid Test Results Lab Results      Component                Value               Date                      Central              NEGATIVE            05/21/2019                 [place]              NEGATIVE            05/14/2019                [place]              Not Detected        02/13/2019                [place]              Not Detected        02/06/2019            Lab Results      Component                Value               Date                      WBC  6.5                 05/02/2019                HGB                      13.0                05/02/2019                HCT                      41.1                05/02/2019                MCV                      90.5                05/02/2019                PLT                      238                 05/02/2019            EKG: normal sinus rhythm. )       Anesthesia Quick Evaluation

## 2019-05-24 NOTE — Transfer of Care (Signed)
Immediate Anesthesia Transfer of Care Note  Patient: Mackenzie Gibbs  Procedure(s) Performed: RADIOACTIVE SEED GUIDED RIGHT BREAST LUMPECTOMY, RIGHT AXIALLARY SENTINEL LYMPH NODE BIOPSY (Right Breast)  Patient Location: PACU  Anesthesia Type:GA combined with regional for post-op pain  Level of Consciousness: drowsy and patient cooperative  Airway & Oxygen Therapy: Patient Spontanous Breathing and Patient connected to face mask oxygen  Post-op Assessment: Report given to RN and Post -op Vital signs reviewed and stable  Post vital signs: Reviewed and stable  Last Vitals:  Vitals Value Taken Time  BP    Temp    Pulse 76 05/24/19 0836  Resp    SpO2 100 % 05/24/19 0836  Vitals shown include unvalidated device data.  Last Pain:  Vitals:   05/24/19 0657  TempSrc: Oral  PainSc: 0-No pain         Complications: No apparent anesthesia complications

## 2019-05-24 NOTE — Anesthesia Postprocedure Evaluation (Signed)
Anesthesia Post Note  Patient: Tyme Brumit  Procedure(s) Performed: RADIOACTIVE SEED GUIDED RIGHT BREAST LUMPECTOMY, RIGHT AXIALLARY SENTINEL LYMPH NODE BIOPSY (Right Breast)     Patient location during evaluation: PACU Anesthesia Type: General Level of consciousness: awake and alert Pain management: pain level controlled Vital Signs Assessment: post-procedure vital signs reviewed and stable Respiratory status: spontaneous breathing, nonlabored ventilation, respiratory function stable and patient connected to nasal cannula oxygen Cardiovascular status: blood pressure returned to baseline and stable Postop Assessment: no apparent nausea or vomiting Anesthetic complications: no    Last Vitals:  Vitals:   05/24/19 0839 05/24/19 0845  BP: (!) 84/46 136/62  Pulse: (!) 58 71  Resp: 13 16  Temp: 36.7 C   SpO2: 100% 100%    Last Pain:  Vitals:   05/24/19 0839  TempSrc:   PainSc: Asleep                 Effie Berkshire

## 2019-05-24 NOTE — Progress Notes (Signed)
Assisted Dr. Smith Robert with right, ultrasound guided, pectoralis block. Side rails up, monitors on throughout procedure. See vital signs in flow sheet. Tolerated Procedure well.

## 2019-05-24 NOTE — Op Note (Signed)
Preoperative diagnosis:clinical stage I right breast cancer Postoperative diagnosis: Same as above Procedure:Right breastseedguidedlumpectomy Rightdeep axillary sentinel node biopsy Surgeon: Dr. Serita Grammes Anesthesia: General  Specimens: 1.Right breast tissue marked with paint, containing seedandclip 2. Rightdeep axillary nodeswith highest count of128 Estimated blood [id number] cc Complications: None Drains: None Sponge and count was correct at completion Disposition to recovery in stable condition  Indications: 73 yof referred by [name] for new right breast cancer. she had no mass or dc. she had screening detected breast mass. has b density breasts. she had small uoq mass. US shows a 6x5x5 mm mass. 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 We decided to proceed with lumpectomy and sn biopsy  Procedure: After informed consent was obtained shewas placed under general anesthesia without complication.She had undergone a pectoral block and injected with technetium in standard periareolar fashion.She was given antibiotics. SCDs were in place. She was then prepped and draped in the standard sterile surgical fashion. A surgical timeout was then performed.  I then identified the seedin theupper outer right breast. Iinfiltrated Marcaine.I made a periareolar incision in order to hide the scar later.I then tunneled to the seed using the neoprobe for guidance. I then removed the seed and the surrounding tissue with an attempt to get a clear margin.   Mammogram confirmed removal of seed and clip. The margins all appeared clear I then obtained hemostasis. I then placed clips in the cavity. I closed the breast tissue with 2-0 vicryl, 3-0 vicryl and5-0 monocryl. I then placed glue and steristrips.  I then located the sentinel nodesin the low axilla. I made an incision at the axillary hairline after infiltrating  marcaine.  Ienteredthe axillary fascia. I then identified the sentinel node with a count of128. The background radioactivity was less than 10% and there were no palpable nodes present.I then obtained hemostasis. I closed the fascia with 2-0 vicryl. I closed this with 2-0 vicryl 3-0 vicryl and 4-0 monocryl. She had a binder placed. She was extubated and transferred to recovery stable

## 2019-05-24 NOTE — Interval H&P Note (Signed)
History and Physical Interval Note:  05/24/2019 7:16 AM  Mackenzie Gibbs  has presented today for surgery, with the diagnosis of RIGHT BREAST CANCER.  The various methods of treatment have been discussed with the patient and family. After consideration of risks, benefits and other options for treatment, the patient has consented to  Procedure(s): RIGHT BREAST LUMPECTOMY WITH RADIOACTIVE SEED AND RIGHT AXIALLARY SENTINEL LYMPH NODE BIOPSY (Right) as a surgical intervention.  The patient's history has been reviewed, patient examined, no change in status, stable for surgery.  I have reviewed the patient's chart and labs.  Questions were answered to the patient's satisfaction.     Rolm Bookbinder

## 2019-05-24 NOTE — Anesthesia Procedure Notes (Signed)
Anesthesia Regional Block: Pectoralis block   Pre-Anesthetic Checklist: ,, timeout performed, Correct Patient, Correct Site, Correct Laterality, Correct Procedure, Correct Position, site marked, Risks and benefits discussed,  Surgical consent,  Pre-op evaluation,  At surgeon's request and post-op pain management  Laterality: Right  Prep: chloraprep       Needles:  Injection technique: Single-shot  Needle Type: Echogenic Stimulator Needle     Needle Length: 9cm  Needle Gauge: 21     Additional Needles:   Procedures:,,,, ultrasound used (permanent image in chart),,,,  Narrative:  Start time: 05/24/2019 7:05 AM End time: 05/24/2019 7:15 AM Injection made incrementally with aspirations every 5 mL.  Performed by: Personally  Anesthesiologist: Effie Berkshire, MD  Additional Notes: Patient tolerated the procedure well. Local anesthetic introduced in an incremental fashion under minimal resistance after negative aspirations. No paresthesias were elicited. After completion of the procedure, no acute issues were identified and patient continued to be monitored by RN.

## 2019-05-25 ENCOUNTER — Encounter: Payer: Self-pay | Admitting: *Deleted

## 2019-05-25 NOTE — Telephone Encounter (Signed)
Spoke with daughter, DPR on file.  Made her aware of information.  Dr. Wynetta Emery appreciative for call.

## 2019-05-28 LAB — SURGICAL PATHOLOGY

## 2019-05-29 ENCOUNTER — Telehealth: Payer: Self-pay | Admitting: *Deleted

## 2019-05-29 NOTE — Telephone Encounter (Signed)
Ordered oncotype per Dr .Jana Hakim requisition faxed to pathology.

## 2019-06-01 ENCOUNTER — Other Ambulatory Visit (HOSPITAL_COMMUNITY): Payer: Medicare PPO

## 2019-06-05 ENCOUNTER — Encounter (HOSPITAL_COMMUNITY): Payer: Medicare PPO

## 2019-06-05 DIAGNOSIS — C50411 Malignant neoplasm of upper-outer quadrant of right female breast: Secondary | ICD-10-CM | POA: Diagnosis not present

## 2019-06-05 DIAGNOSIS — Z170 Estrogen receptor positive status [ER+]: Secondary | ICD-10-CM | POA: Diagnosis not present

## 2019-06-06 ENCOUNTER — Telehealth: Payer: Self-pay | Admitting: *Deleted

## 2019-06-06 DIAGNOSIS — C50411 Malignant neoplasm of upper-outer quadrant of right female breast: Secondary | ICD-10-CM

## 2019-06-06 NOTE — Telephone Encounter (Signed)
Rceived oncotype results of 16/4%.  Patient is aware.  Will refer to Dr. Lisbeth Renshaw to discuss +/- xrt.

## 2019-06-07 ENCOUNTER — Encounter: Payer: Self-pay | Admitting: Physical Therapy

## 2019-06-07 ENCOUNTER — Ambulatory Visit: Payer: Medicare PPO | Attending: General Surgery | Admitting: Physical Therapy

## 2019-06-07 ENCOUNTER — Other Ambulatory Visit: Payer: Self-pay

## 2019-06-07 DIAGNOSIS — Z483 Aftercare following surgery for neoplasm: Secondary | ICD-10-CM | POA: Diagnosis not present

## 2019-06-07 DIAGNOSIS — Z170 Estrogen receptor positive status [ER+]: Secondary | ICD-10-CM | POA: Insufficient documentation

## 2019-06-07 DIAGNOSIS — C50411 Malignant neoplasm of upper-outer quadrant of right female breast: Secondary | ICD-10-CM | POA: Insufficient documentation

## 2019-06-07 DIAGNOSIS — R293 Abnormal posture: Secondary | ICD-10-CM

## 2019-06-07 NOTE — Therapy (Addendum)
[place], [place], [address] Phone: [phone]   Fax:  [phone]  Physical Therapy Treatment  Patient Details  Name: Mackenzie Gibbs MRN: [id number] Date of Birth: 1940-11-24 Referring Provider (PT): Dr. Rolm Bookbinder   Encounter Date: 06/07/2019  PT End of Session - 06/07/19 1237    Visit Number  2    Number of Visits  2    PT Start Time  1102    PT Stop Time  1155    PT Time Calculation (min)  53 min    Activity Tolerance  Patient tolerated treatment well    Behavior During Therapy  [facility] for tasks assessed/performed       Past Medical History:  Diagnosis Date  . 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 Cerebrovascular disease   . Diabetes mellitus without complication ([place])   . Dry eyes   . Hyperlipidemia   . Hypertension   . Reflux   . Stenosis of left vertebral artery    75%    Past Surgical History:  Procedure Laterality Date  . ABDOMINAL HYSTERECTOMY  1988   partial, per patient  . BREAST LUMPECTOMY WITH RADIOACTIVE SEED AND SENTINEL LYMPH NODE BIOPSY Right 05/24/2019   Procedure: RADIOACTIVE SEED GUIDED RIGHT BREAST LUMPECTOMY, RIGHT AXIALLARY SENTINEL LYMPH NODE BIOPSY;  Surgeon: Rolm Bookbinder, MD;  Location: [facility];  Service: General;  Laterality: Right;  . cornary artery stents     2  . TONSILLECTOMY      There were no vitals filed for this visit.  Subjective Assessment - 06/07/19 1017    Subjective  Patient underwent a right lumpectomy and sentinel node biopsy (3 negative nodes removed) on 05/24/2019. Oncotype score was very low but she may undergo radiation.    Pertinent History  Patient was diagnosed on 04/13/2019 with right invasive lobular carcinoma breast cancer. Patient underwent a right lumpectomy and sentinel node biopsy (3 negative nodes removed) on  05/24/2019. It is ER/PR positive and HER2 negative with a Ki67 of 25%.    Patient Stated Goals  See if my arm is ok    Currently in Pain?  Yes    Pain Score  2     Pain Location  Breast    Pain Orientation  Right    Pain Descriptors / Indicators  Tender    Pain Type  Surgical pain    Pain Onset  1 to 4 weeks ago    Pain Frequency  Intermittent    Aggravating Factors   Pressing on it    Pain Relieving Factors  Nothing    Multiple Pain Sites  No         [facility] PT Assessment - 06/07/19 0001      Assessment   Medical Diagnosis  s/p right lumpectomy and SLNB    Referring Provider (PT)  Dr. Rolm Bookbinder    Onset Date/Surgical Date  05/24/19    Hand Dominance  Right    Prior Therapy  baseline      Precautions   Precautions  Other (comment)    Precaution Comments  recent surgery; right arm lymphedema risk      Restrictions   Weight Bearing Restrictions  No      Balance Screen   Has the patient fallen in the past 6 months  No    Has the patient had a decrease in  activity level because of a fear of falling?   No    Is the patient reluctant to leave their home because of a fear of falling?   No      Home Environment   Living Environment  Private residence    Living Arrangements  Alone    Available Help at Discharge  Family      Prior Function   Level of [place]  Retired    Leisure  Plans to start walking      Cognition   Overall Cognitive Status  Within Functional Limits for tasks assessed      Observation/Other Assessments   Observations  Incisions appear to be healing well. She has what appears to be a mild seroma just superior to her breast incision with some thickness present. L-Dex score was -3.5.      Posture/Postural Control   Posture/Postural Control  Postural limitations    Postural Limitations  Rounded Shoulders;Forward head      ROM / Strength   AROM / PROM / Strength  AROM      AROM   AROM Assessment Site  Shoulder     Right/Left Shoulder  Right    Right Shoulder Extension  59 Degrees    Right Shoulder Flexion  137 Degrees    Right Shoulder ABduction  144 Degrees    Right Shoulder Internal Rotation  68 Degrees    Right Shoulder External Rotation  74 Degrees        LYMPHEDEMA/ONCOLOGY QUESTIONNAIRE - 06/07/19 1021      Type   Cancer Type  Right breast cancer      Surgeries   Lumpectomy Date  05/24/19    Sentinel Lymph Node Biopsy Date  05/24/19    Number Lymph Nodes Removed  3      Treatment   Active Chemotherapy Treatment  No    Past Chemotherapy Treatment  No    Active Radiation Treatment  No    Past Radiation Treatment  No    Current Hormone Treatment  No    Past Hormone Therapy  No      What other symptoms do you have   Are you Having Heaviness or Tightness  No    Are you having Pain  Yes    Are you having pitting edema  No    Is it Hard or Difficult finding clothes that fit  No    Do you have infections  No    Is there Decreased scar mobility  Yes    Stemmer Sign  No      Lymphedema Assessments   Lymphedema Assessments  Upper extremities      Right Upper Extremity Lymphedema   10 cm Proximal to Olecranon Process  26.2 cm    Olecranon Process  22.8 cm    10 cm Proximal to Ulnar Styloid Process  18.8 cm    Just Proximal to Ulnar Styloid Process  14.5 cm    Across Hand at [employer]  18.6 cm    At [place] of 2nd Digit  5.9 cm      Left Upper Extremity Lymphedema   10 cm Proximal to Olecranon Process  26 cm    Olecranon Process  22.4 cm    10 cm Proximal to Ulnar Styloid Process  17.6 cm    Just Proximal to Ulnar Styloid Process  14.1 cm    Across Hand at [employer]  17.8  cm    At [facility] of 2nd Digit  5.3 cm        Katina Dung - 06/07/19 0001    Open a tight or new jar  No difficulty    Do heavy household chores (wash walls, wash floors)  No difficulty    Carry a shopping bag or briefcase  No difficulty    Wash your back  No difficulty    Use a knife to cut food   No difficulty    Recreational activities in which you take some force or impact through your arm, shoulder, or hand (golf, hammering, tennis)  No difficulty    During the past week, to what extent has your arm, shoulder or hand problem interfered with your normal social activities with family, friends, neighbors, or groups?  Not at all    During the past week, to what extent has your arm, shoulder or hand problem limited your work or other regular daily activities  Not at all    Arm, shoulder, or hand pain.  Mild    Tingling (pins and needles) in your arm, shoulder, or hand  None    Difficulty Sleeping  No difficulty    DASH Score  2.27 %                     PT Education - 06/07/19 1236    Education Details  Reviewed information about lymphedema risk reduction and signed her up for ABC class on 06/11/2019. Reviewed previously given HEP and pt verbalized good understanding.    Person(s) Educated  Patient;Child(ren)    Methods  Explanation;Demonstration;Handout    Comprehension  Returned demonstration;Verbalized understanding          PT Long Term Goals - 06/07/19 1245      PT LONG TERM GOAL #1   Title  Patient will demonstrate she has regained full shoulder ROM and function post operatively compared to baselines.    Time  8    Period  Weeks    Status  Achieved            Plan - 06/07/19 1241    Clinical Impression Statement  Patient is doing very well s/p right lumpectomy and sentinel node biopsy 2 weeks ago. She has nearly full shoulder ROM compared to baselines and no sign of lymphedema. She does have a small area that appears to be a seroma with some thickening which appears typical for 2 weeks post op. She has no PT needs at this time but was assessed today using the SOZO machine to determine an L-Dex score and will be assessed every 3 months for L-Dex testing.    PT Treatment/Interventions  ADLs/Self Care Home Management;Therapeutic exercise;Patient/family  education    PT Next Visit Plan  D?C - will reassess every 3 months for L-Dex scores    PT Home Exercise Plan  Post op shoulder ROM HEP    Consulted and Agree with Plan of Care  Patient;Family member/caregiver    Family Member Consulted  Daughter       Patient will benefit from skilled therapeutic intervention in order to improve the following deficits and impairments:  Decreased range of motion, Postural dysfunction, Impaired UE functional use, Pain, Decreased knowledge of precautions  Visit Diagnosis: Malignant neoplasm of upper-outer quadrant of right breast in female, estrogen receptor positive ([place])  Abnormal posture  Aftercare following surgery for neoplasm  The patient was assessed using the L-Dex machine today to produce a lymphedema  index baseline score. The patient will be reassessed on a regular basis (typically every 3 months) to obtain new L-Dex scores. If the score is > 6.5 points away from his/her baseline score indicating onset of subclinical lymphedema, it will be recommended to wear a compression garment for 4 weeks, 12 hours per day and then be reassessed. If the score continues to be > 6.5 points from baseline at reassessment, we will initiate lymphedema treatment. Assessing in this manner has a 95% rate of preventing clinically significant lymphedema.    Problem List Patient Active Problem List   Diagnosis Date Noted  . Malignant neoplasm of upper-outer quadrant of right breast in female, estrogen receptor positive ([place]) 04/25/2019  . 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 Diabetes mellitus without complication ([place])   . Hypertension   . Hyperlipidemia   . Cerebrovascular disease   . Stenosis of left vertebral artery   . Reflux   . Dry eyes    Annia Friendly, [place] 06/07/19 12:46 PM  [place], [place], [address] Phone: [phone]    Fax:  [phone]  Name: Mackenzie Gibbs MRN: [id number] Date of Birth: June 19, 1940  PHYSICAL THERAPY DISCHARGE SUMMARY  Visits from Start of Care: 2  Current functional level related to goals / functional outcomes: Goals met. See above for objective findings.   Remaining deficits: Mild seroma right breast.   Education / Equipment: Lymphedema risk reduction and HEP Plan: Patient agrees to discharge.  Patient goals were met. Patient is being discharged due to meeting the stated rehab goals.  ?????     '    Annia Friendly, [place] 06/07/19 12:48 PM

## 2019-06-11 ENCOUNTER — Telehealth: Payer: Self-pay | Admitting: *Deleted

## 2019-06-11 NOTE — Telephone Encounter (Signed)
Patient called and said that she will not be needing radiation treatment according to her surgeon.

## 2019-06-12 ENCOUNTER — Ambulatory Visit: Payer: Medicare PPO | Attending: Family

## 2019-06-12 DIAGNOSIS — Z23 Encounter for immunization: Secondary | ICD-10-CM

## 2019-06-12 NOTE — Progress Notes (Signed)
   Covid-19 Vaccination Clinic  Name:  Mackenzie Gibbs    MRN: [id number] DOB: 1941/03/20  06/12/2019  Mackenzie Gibbs was observed post Covid-19 immunization for 15 minutes without incident. She was provided with Vaccine Information Sheet and instruction to access the V-Safe system.   Mackenzie Gibbs was instructed to call 911 with any severe reactions post vaccine: [name] Difficulty breathing  . Swelling of face and throat  . A fast heartbeat  . A bad rash all over body  . Dizziness and weakness   Immunizations Administered    Name Date Dose VIS Date Route   Moderna COVID-19 Vaccine 06/12/2019 11:02 AM 0.5 mL 02/27/2019 Intramuscular   Manufacturer: Moderna   Lot: [id number]   [id number]

## 2019-06-13 ENCOUNTER — Encounter: Payer: Self-pay | Admitting: *Deleted

## 2019-06-14 ENCOUNTER — Encounter: Payer: Self-pay | Admitting: *Deleted

## 2019-06-14 ENCOUNTER — Ambulatory Visit: Payer: Medicare PPO | Admitting: Radiation Oncology

## 2019-06-14 ENCOUNTER — Ambulatory Visit: Payer: Medicare PPO

## 2019-07-10 NOTE — Progress Notes (Signed)
[facility]  [phone] [phone]     ID: Khamil Lamica DOB: [birth year]  MR#: [id number]  TMH#:[id number]  Patient Care Team: Lavone Orn, MD as PCP - General (Internal Medicine) Belva Crome, MD as PCP - Cardiology (Cardiology) Rockwell Germany, RN as Oncology Nurse Navigator Mauro Kaufmann, RN as Oncology Nurse Navigator Rolm Bookbinder, MD as Consulting Physician (General Surgery) Ranvir Renovato, Virgie Dad, MD as Consulting Physician (Oncology) Kyung Rudd, MD as Consulting Physician (Radiation Oncology) Belva Crome, MD as Consulting Physician (Cardiology) Jola Schmidt, MD as Consulting Physician (Ophthalmology) Chauncey Cruel, MD OTHER MD:  CHIEF COMPLAINT: estrogen receptor positive breast cancer  CURRENT TREATMENT: Anastrozole   INTERVAL HISTORY: Mazie returns today for follow up of her estrogen receptor positive breast cancer. She was evaluated in the multidisciplinary breast cancer clinic on 05/02/2019.  Since consultation, she underwent right lumpectomy and sentinel lymph node biopsy on 05/24/2019 under Dr. Donne Hazel. Pathology from the procedure ([id number]) showed: invasive lobular carcinoma, grade 2, 1.2 cm; resection margins negative.  All three biopsied lymph nodes were negative (0/3).  Oncotype DX was obtained on the final surgical sample and the recurrence score of 16 predicts a risk of recurrence outside the breast over the next 9 years of 4%, if the patient's only systemic therapy is an antiestrogen for 5 years.  It also predicts no benefit from chemotherapy.  She has received both doses of her Covid-19 vaccine-- on 05/10/2019 and 06/12/2019.   REVIEW OF SYSTEMS: Shikara did very well with the surgery, with no significant pain, bleeding, or fever.  She is pretty much back to baseline, doing a lot of stretching exercises and some walking.  She tells me her daughter who is only in her early 108s has been found to have  breast cancer as well and is considering bilateral mastectomies.   HISTORY OF CURRENT ILLNESS: From the original intake note:  Aliscia Clayton had routine screening mammography on 04/13/2019 showing a possible abnormality in the right breast. She underwent right diagnostic mammography with tomography and right breast ultrasonography at [facility] on 04/19/2019 showing: breast density category B; suspicious 0.6 cm right breast mass at 10 o'clock; no suspicious-appearing lymph nodes.  Accordingly on 04/20/2019 she proceeded to biopsy of the right breast area in question. The pathology from this procedure ([id number]) showed: invasive mammary carcinoma with lobular features, grade 2, e-cadherin weakly positive.  (This was presented as lobular on conference 05/02/2019).  Prognostic indicators significant for: estrogen receptor, 90% positive and progesterone receptor, 95% positive, both with strong staining intensity. Proliferation marker Ki67 at 25%. HER2 negative by immunohistochemistry (1+).  The patient's subsequent history is as detailed below.   PAST MEDICAL HISTORY: Past Medical History:  Diagnosis Date  . 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 Cerebrovascular disease   . Diabetes mellitus without complication ([place])   . Dry eyes   . Hyperlipidemia   . Hypertension   . Reflux   . Stenosis of left vertebral artery    75%    PAST SURGICAL HISTORY: Past Surgical History:  Procedure Laterality Date  . ABDOMINAL HYSTERECTOMY  1988   partial, per patient  . BREAST LUMPECTOMY WITH RADIOACTIVE SEED AND SENTINEL LYMPH NODE BIOPSY Right 05/24/2019   Procedure: RADIOACTIVE SEED GUIDED RIGHT BREAST LUMPECTOMY, RIGHT AXIALLARY SENTINEL LYMPH NODE BIOPSY;  Surgeon: Rolm Bookbinder, MD;  Location: [facility];  Service:  General;  Laterality: Right;  . cornary artery stents     2  .  TONSILLECTOMY      FAMILY HISTORY: Family History  Problem Relation Age of Onset  . Stroke Father   . Hypertension Mother   . Breast cancer Mother 35  . Diabetes Other   . Hypertension Other   . Arthritis Other   . Breast cancer Other   . Diabetes Other   . Coronary artery disease Brother   . Alcohol abuse Other    Patient's father was 29 years old when he died from cerebral hemorrhage. Patient's mother died from breast cancer at age 77. She was diagnosed at age 54. The patient denies a family hx of ovarian cancer. She does note a maternal aunt with "stomach" cancer. She has 1 brother.   GYNECOLOGIC HISTORY:  No LMP recorded. Patient is postmenopausal. Menarche: 79 years old Age at first live birth: 79 years old [place] P 3 LMP age 66 due to hysterectomy Contraceptive: never used HRT never used  Hysterectomy? Yes, for uterine fibroids BSO? 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 She is divorced. She lives at home by herself, with no pets son DESERAY DAPONTE III, age 23, is an Sales executive in [place], [place]. Daughter Dr. Radene Ou, age 7, is a podiatrist in [place], [place]. Daughter Felizardo Hoffmann, age 30, is an event planner in [place]. The patient has two grandchildren and one great-grandchild. She attends [employer] DIRECTIVES: Not addressed   HEALTH MAINTENANCE: Social History   Tobacco Use  . Smoking status: Former Research scientist (life sciences)  . Smokeless tobacco: Never Used  Substance Use Topics  . Alcohol use: Yes    Alcohol/week: 0.0 standard drinks    Comment: social  . Drug use: No     Colonoscopy: 2017  PAP: none on file  Bone density: date unknown   Allergies  Allergen Reactions  . Dristan Cold [Chlorphen-Pe-Acetaminophen]     EYES PUFFY  . Kiwi Extract      DEATHLY ILL    Current Outpatient Medications  Medication Sig Dispense Refill  . anastrozole (ARIMIDEX) 1 MG tablet Take 1 tablet (1 mg total) by mouth daily. 90 tablet 4  . aspirin EC 81 MG tablet Take 81 mg by mouth daily.    [name] atorvastatin (LIPITOR) 20 MG tablet Take 40 mg by mouth daily.     . beta carotene w/minerals (OCUVITE) tablet Take 1 tablet by mouth daily.    . cycloSPORINE (RESTASIS) 0.05 % ophthalmic emulsion Place 1 drop into both eyes daily.     [name] glucose blood test strip 1 each by Other route as needed for other. Use as instructed    . Lancets 30G MISC by Does not apply route. ONE TOUCH ULTRA 250.00    . lisinopril-hydrochlorothiazide (PRINZIDE,ZESTORETIC) 10-12.5 MG per tablet Take 1 tablet by mouth daily.    . metFORMIN (GLUCOPHAGE) 500 MG tablet Take 1,000 mg by mouth 2 (two) times daily with a meal.     . nitroGLYCERIN (NITROSTAT) 0.4 MG SL tablet Place 1 tablet (0.4 mg total) under the tongue every 5 (five) minutes as needed for chest pain. 25 tablet 1   No current facility-administered medications for this visit.    OBJECTIVE:  African-American woman  who appears stated age  89:   07/11/19 1358  BP: 125/68  Pulse: 85  Resp: 18  Temp: 98 F (36.7 C)  SpO2: 99%     Body mass index is 21.13 kg/m.   Wt Readings from Last 3 Encounters:  07/11/19 139 lb (63 kg)  05/24/19 141 lb 8.6 oz (64.2 kg)  05/04/19 137 lb (62.1 kg)      ECOG FS:1 - Symptomatic but completely ambulatory  Sclerae unicteric, EOMs intact Wearing a mask No cervical or supraclavicular adenopathy Lungs no rales or rhonchi Heart regular rate and rhythm Abd soft, nontender, positive bowel sounds MSK no focal spinal tenderness, no upper extremity lymphedema Neuro: nonfocal, well oriented, appropriate affect Breasts: The right breast is status post lumpectomy.  The cosmetic result is excellent.  The left breast is benign.  Both axillae are benign   LAB RESULTS:  CMP     Component  Value Date/Time   NA 143 07/11/2019 1340   K 3.7 07/11/2019 1340   CL 105 07/11/2019 1340   CO2 29 07/11/2019 1340   GLUCOSE 181 (H) 07/11/2019 1340   BUN 18 07/11/2019 1340   CREATININE 0.97 07/11/2019 1340   CREATININE 0.93 05/02/2019 1221   CALCIUM 9.5 07/11/2019 1340   PROT 7.1 07/11/2019 1340   ALBUMIN 3.9 07/11/2019 1340   AST 17 07/11/2019 1340   AST 14 (L) 05/02/2019 1221   ALT 32 07/11/2019 1340   ALT 17 05/02/2019 1221   ALKPHOS 74 07/11/2019 1340   BILITOT 0.4 07/11/2019 1340   BILITOT 0.3 05/02/2019 1221   GFRNONAA 56 (L) 07/11/2019 1340   GFRNONAA 59 (L) 05/02/2019 1221   GFRAA >60 07/11/2019 1340   GFRAA >60 05/02/2019 1221    No results found for: TOTALPROTELP, ALBUMINELP, A1GS, A2GS, BETS, BETA2SER, GAMS, MSPIKE, SPEI  Lab Results  Component Value Date   WBC 6.7 07/11/2019   NEUTROABS 3.2 07/11/2019   HGB 12.6 07/11/2019   HCT 40.2 07/11/2019   MCV 90.7 07/11/2019   PLT 222 07/11/2019    No results found for: LABCA2  No components found for: MLYYTK354  No results for input(s): INR in the last 168 hours.  No results found for: LABCA2  No results found for: SFK812  No results found for: XNT700  No results found for: FVC944  No results found for: CA2729  No components found for: HGQUANT  No results found for: CEA1 / No results found for: CEA1   No results found for: AFPTUMOR  No results found for: CHROMOGRNA  No results found for: KPAFRELGTCHN, LAMBDASER, KAPLAMBRATIO (kappa/lambda light chains)  No results found for: HGBA, HGBA2QUANT, HGBFQUANT, HGBSQUAN (Hemoglobinopathy evaluation)   No results found for: LDH  No results found for: IRON, TIBC, IRONPCTSAT ([place] and TIBC)  No results found for: FERRITIN  Urinalysis No results found for: COLORURINE, APPEARANCEUR, LABSPEC, PHURINE, GLUCOSEU, HGBUR, BILIRUBINUR, KETONESUR, PROTEINUR, UROBILINOGEN, NITRITE, LEUKOCYTESUR   STUDIES: No results found.   ELIGIBLE FOR AVAILABLE  RESEARCH PROTOCOL: no  ASSESSMENT: 79 y.o. [place] woman status post right breast upper outer quadrant biopsy 04/20/2019 for a clinical T1b N0, stage IA invasive lobular carcinoma (E-cadherin weakly positive), grade 2, estrogen and progesterone receptor positive, HER-2 not amplified, with an MIB-1 of 25%  (1) status post right lumpectomy and sentinel lymph node sampling 05/24/2019 for a pT1C pN0, stage 1A invasive lobular breast cancer, grade 2, with negative margins  (a) a total of 3 axillary lymph nodes were removed  (2) Oncotype score  of 16 predicts a risk of recurrence outside the breast in the next 9 years of 4% if the patient's only systemic therapy is antiestrogens for 5 years  (3) patient opted against adjuvant radiation  (4) anastrozole started 07/11/2019   PLAN: Leeman did well with her breast surgery and she is a very good candidate to avoid adjuvant radiation, which was her decision.  She will benefit from 5 years of antiestrogen's and today we discussed anastrozole in detail.  She has a good understanding of the possible toxicities side effects and complications.  I have gone ahead and placed the prescription in for her.  She will see me in about 35month.  If there is any issue related to the medication she will call and let uKoreaknow.  She will have a bone density study with her next mammogram.  I have encouraged her to continue to do her stretching and other exercises.  She knows to call for any other issue that may develop before the next visit.  Total encounter time 30 minutes.*Chauncey Cruel MD   07/11/2019 2:24 PM Medical Oncology and H[facility] [place] [id number]. [phone]   Fax. [phone]  This document serves as a record of services personally performed by GLurline Del MD. It was created on his behalf by KWilburn Mylar a trained medical scribe. The creation of this record is based on the  scribe's personal observations and the provider's statements to them.   I, GLurline DelMD, have reviewed the above documentation for accuracy and completeness, and I agree with the above.   *Total Encounter Time as defined by the Centers for Medicare and Medicaid Services includes, in addition to the face-to-face time of a patient visit (documented in the note above) non-face-to-face time: obtaining and reviewing outside history, ordering and reviewing medications, tests or procedures, care coordination (communications with other health care professionals or caregivers) and documentation in the medical record.

## 2019-07-11 ENCOUNTER — Inpatient Hospital Stay: Payer: Medicare PPO | Attending: Oncology | Admitting: Oncology

## 2019-07-11 ENCOUNTER — Other Ambulatory Visit: Payer: Self-pay

## 2019-07-11 ENCOUNTER — Inpatient Hospital Stay: Payer: Medicare PPO

## 2019-07-11 ENCOUNTER — Encounter: Payer: Self-pay | Admitting: *Deleted

## 2019-07-11 VITALS — BP 125/68 | HR 85 | Temp 98.0°F | Resp 18 | Ht 68.0 in | Wt 139.0 lb

## 2019-07-11 DIAGNOSIS — Z87891 Personal history of nicotine dependence: Secondary | ICD-10-CM | POA: Insufficient documentation

## 2019-07-11 DIAGNOSIS — I10 Essential (primary) hypertension: Secondary | ICD-10-CM | POA: Insufficient documentation

## 2019-07-11 DIAGNOSIS — I2510 Atherosclerotic heart disease of native coronary artery without angina pectoris: Secondary | ICD-10-CM | POA: Insufficient documentation

## 2019-07-11 DIAGNOSIS — Z79899 Other long term (current) drug therapy: Secondary | ICD-10-CM | POA: Diagnosis not present

## 2019-07-11 DIAGNOSIS — E782 Mixed hyperlipidemia: Secondary | ICD-10-CM

## 2019-07-11 DIAGNOSIS — Z79811 Long term (current) use of aromatase inhibitors: Secondary | ICD-10-CM | POA: Diagnosis not present

## 2019-07-11 DIAGNOSIS — Z7982 Long term (current) use of aspirin: Secondary | ICD-10-CM | POA: Insufficient documentation

## 2019-07-11 DIAGNOSIS — I679 Cerebrovascular disease, unspecified: Secondary | ICD-10-CM | POA: Diagnosis not present

## 2019-07-11 DIAGNOSIS — Z7984 Long term (current) use of oral hypoglycemic drugs: Secondary | ICD-10-CM | POA: Diagnosis not present

## 2019-07-11 DIAGNOSIS — Z170 Estrogen receptor positive status [ER+]: Secondary | ICD-10-CM

## 2019-07-11 DIAGNOSIS — E119 Type 2 diabetes mellitus without complications: Secondary | ICD-10-CM | POA: Insufficient documentation

## 2019-07-11 DIAGNOSIS — I25119 Atherosclerotic heart disease of native coronary artery with unspecified angina pectoris: Secondary | ICD-10-CM

## 2019-07-11 DIAGNOSIS — C50411 Malignant neoplasm of upper-outer quadrant of right female breast: Secondary | ICD-10-CM | POA: Insufficient documentation

## 2019-07-11 DIAGNOSIS — I6502 Occlusion and stenosis of left vertebral artery: Secondary | ICD-10-CM

## 2019-07-11 LAB — CBC WITH DIFFERENTIAL/PLATELET
Abs Immature Granulocytes: 0.03 10*3/uL (ref 0.00–0.07)
Basophils Absolute: 0 10*3/uL (ref 0.0–0.1)
Basophils Relative: 0 %
Eosinophils Absolute: 0.6 10*3/uL — ABNORMAL HIGH (ref 0.0–0.5)
Eosinophils Relative: 9 %
HCT: 40.2 % (ref 36.0–46.0)
Hemoglobin: 12.6 g/dL (ref 12.0–15.0)
Immature Granulocytes: 0 %
Lymphocytes Relative: 36 %
Lymphs Abs: 2.4 10*3/uL (ref 0.7–4.0)
MCH: 28.4 pg (ref 26.0–34.0)
MCHC: 31.3 g/dL (ref 30.0–36.0)
MCV: 90.7 fL (ref 80.0–100.0)
Monocytes Absolute: 0.4 10*3/uL (ref 0.1–1.0)
Monocytes Relative: 6 %
Neutro Abs: 3.2 10*3/uL (ref 1.7–7.7)
Neutrophils Relative %: 49 %
Platelets: 222 10*3/uL (ref 150–400)
RBC: 4.43 MIL/uL (ref 3.87–5.11)
RDW: 14.7 % (ref 11.5–15.5)
WBC: 6.7 10*3/uL (ref 4.0–10.5)
nRBC: 0 % (ref 0.0–0.2)

## 2019-07-11 LAB — COMPREHENSIVE METABOLIC PANEL
ALT: 32 U/L (ref 0–44)
AST: 17 U/L (ref 15–41)
Albumin: 3.9 g/dL (ref 3.5–5.0)
Alkaline Phosphatase: 74 U/L (ref 38–126)
Anion gap: 9 (ref 5–15)
BUN: 18 mg/dL (ref 8–23)
CO2: 29 mmol/L (ref 22–32)
Calcium: 9.5 mg/dL (ref 8.9–10.3)
Chloride: 105 mmol/L (ref 98–111)
Creatinine, Ser: 0.97 mg/dL (ref 0.44–1.00)
GFR calc Af Amer: >60 mL/min (ref >60)
GFR calc non Af Amer: 56 mL/min — ABNORMAL LOW (ref >60)
Glucose, Bld: 181 mg/dL — ABNORMAL HIGH (ref 70–99)
Potassium: 3.7 mmol/L (ref 3.5–5.1)
Sodium: 143 mmol/L (ref 135–145)
Total Bilirubin: 0.4 mg/dL (ref 0.3–1.2)
Total Protein: 7.1 g/dL (ref 6.5–8.1)

## 2019-07-11 MED ORDER — ANASTROZOLE 1 MG PO TABS: 1.0000 mg | ORAL_TABLET | Freq: Every day | ORAL | 4 refills | Status: DC

## 2019-07-12 ENCOUNTER — Telehealth: Payer: Self-pay | Admitting: Oncology

## 2019-07-12 NOTE — Telephone Encounter (Signed)
Scheduled appts per 4/14 los. Pt confirmed appt date and time.

## 2019-08-16 DIAGNOSIS — I10 Essential (primary) hypertension: Secondary | ICD-10-CM | POA: Diagnosis not present

## 2019-08-16 DIAGNOSIS — C50911 Malignant neoplasm of unspecified site of right female breast: Secondary | ICD-10-CM | POA: Diagnosis not present

## 2019-08-16 DIAGNOSIS — E1151 Type 2 diabetes mellitus with diabetic peripheral angiopathy without gangrene: Secondary | ICD-10-CM | POA: Diagnosis not present

## 2019-08-16 DIAGNOSIS — Z23 Encounter for immunization: Secondary | ICD-10-CM | POA: Diagnosis not present

## 2019-08-16 DIAGNOSIS — E7800 Pure hypercholesterolemia, unspecified: Secondary | ICD-10-CM | POA: Diagnosis not present

## 2019-08-16 DIAGNOSIS — I739 Peripheral vascular disease, unspecified: Secondary | ICD-10-CM | POA: Diagnosis not present

## 2019-08-16 DIAGNOSIS — Z7984 Long term (current) use of oral hypoglycemic drugs: Secondary | ICD-10-CM | POA: Diagnosis not present

## 2019-08-16 DIAGNOSIS — Z170 Estrogen receptor positive status [ER+]: Secondary | ICD-10-CM | POA: Diagnosis not present

## 2019-09-10 ENCOUNTER — Ambulatory Visit: Payer: Medicare PPO | Attending: General Surgery | Admitting: Physical Therapy

## 2019-09-10 ENCOUNTER — Other Ambulatory Visit: Payer: Self-pay

## 2019-09-10 DIAGNOSIS — C50411 Malignant neoplasm of upper-outer quadrant of right female breast: Secondary | ICD-10-CM | POA: Insufficient documentation

## 2019-09-10 DIAGNOSIS — Z170 Estrogen receptor positive status [ER+]: Secondary | ICD-10-CM | POA: Insufficient documentation

## 2019-09-10 DIAGNOSIS — R293 Abnormal posture: Secondary | ICD-10-CM | POA: Insufficient documentation

## 2019-09-10 DIAGNOSIS — Z483 Aftercare following surgery for neoplasm: Secondary | ICD-10-CM | POA: Insufficient documentation

## 2019-09-10 NOTE — Therapy (Signed)
[place], [place], [address] Phone: [phone]   Fax:  [phone]  Physical Therapy Treatment  Patient Details  Name: Secilia Apps MRN: [id number] Date of Birth: [birth year] Referring Provider (PT): Dr. Rolm Bookbinder   Encounter Date: 09/10/2019   PT End of Session - 09/10/19 1129    Visit Number 2    Number of Visits 2    PT Start Time 4008    PT Stop Time 1128    PT Time Calculation (min) 23 min    Activity Tolerance Patient tolerated treatment well    Behavior During Therapy [facility] for tasks assessed/performed           Past Medical History:  Diagnosis Date  . 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 Cerebrovascular disease   . Diabetes mellitus without complication ([place])   . Dry eyes   . Hyperlipidemia   . Hypertension   . Reflux   . Stenosis of left vertebral artery    75%    Past Surgical History:  Procedure Laterality Date  . ABDOMINAL HYSTERECTOMY  1988   partial, per patient  . BREAST LUMPECTOMY WITH RADIOACTIVE SEED AND SENTINEL LYMPH NODE BIOPSY Right 05/24/2019   Procedure: RADIOACTIVE SEED GUIDED RIGHT BREAST LUMPECTOMY, RIGHT AXIALLARY SENTINEL LYMPH NODE BIOPSY;  Surgeon: Rolm Bookbinder, MD;  Location: [place];  Service: General;  Laterality: Right;  . cornary artery stents     2  . TONSILLECTOMY      There were no vitals filed for this visit.   Subjective Assessment - 09/10/19 1128    Subjective Patient reports she is here today for an L-Dex screen    Currently in Pain? No/denies                  L-DEX FLOWSHEETS - 09/10/19 1100      L-DEX LYMPHEDEMA SCREENING   Measurement Type Unilateral    L-DEX MEASUREMENT EXTREMITY Upper Extremity    POSITION  Standing    DOMINANT SIDE Right    At Risk Side Right    BASELINE SCORE (UNILATERAL) -3.5     L-DEX SCORE (UNILATERAL) -2.1    VALUE CHANGE (UNILAT) 1.4              The patient was assessed using the L-Dex machine today to produce a lymphedema index baseline score. The patient will be reassessed on a regular basis (typically every 3 months) to obtain new L-Dex scores. If the score is > 6.5 points away from his/her baseline score indicating onset of subclinical lymphedema, it will be recommended to wear a compression garment for 4 weeks, 12 hours per day and then be reassessed. If the score continues to be > 6.5 points from baseline at reassessment, we will initiate lymphedema treatment. Assessing in this manner has a 95% rate of preventing clinically significant lymphedema.                      PT Long Term Goals - 06/07/19 1245      PT LONG TERM GOAL #1   Title Patient will demonstrate she has regained full shoulder ROM and function post operatively compared to baselines.    Time 8    Period Weeks    Status Achieved                 Plan -  09/10/19 1130    Clinical Impression Statement L-Dex screen was within normal limits.    PT Next Visit Plan Will reassess every 3 months for L-Dex scores           Patient will benefit from skilled therapeutic intervention in order to improve the following deficits and impairments:     Visit Diagnosis: Malignant neoplasm of upper-outer quadrant of right breast in female, estrogen receptor positive ([place])  Abnormal posture  Aftercare following surgery for neoplasm     Problem List Patient Active Problem List   Diagnosis Date Noted  . Malignant neoplasm of upper-outer quadrant of right breast in female, estrogen receptor positive ([place]) 04/25/2019  . 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 Diabetes mellitus without complication ([place])   . Hypertension   . Hyperlipidemia   . Cerebrovascular disease   . Stenosis of left vertebral artery   . Reflux   . Dry  eyes    Annia Friendly, [place] 09/10/19 11:31 AM  [place], [place], [address] Phone: [phone]   Fax:  [phone]  Name: Daneesha Quinteros MRN: [id number] Date of Birth: 02/24/1941

## 2019-09-25 NOTE — Progress Notes (Signed)
Cardiology Office Note:    Date:  09/25/2019   ID:  Mackenzie Gibbs, DOB Jul 31, 1940, MRN [id number]  PCP:  Lavone Orn, MD  Cardiologist:  Sinclair Grooms, MD   Referring MD: Lavone Orn, MD   No chief complaint on file.   History of Present Illness:    Mackenzie Gibbs is a 79 y.o. female with a hx of diabetes type II,essential hypertension, cerebrovascular disease, Hyperlipidemia,CAD with prior first-generation DES to circumflex / right coronary artery 2004,and essential hypertension.  She is asymptomatic relative to cardiac complaints.  She has stopped walking.  She does not mow her grass.  Over the past 12 months she has had to have a lumpectomy related to breast cancer.  Everything went well.  Past Medical History:  Diagnosis Date  . 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 Cerebrovascular disease   . Diabetes mellitus without complication ([place])   . Dry eyes   . Hyperlipidemia   . Hypertension   . Reflux   . Stenosis of left vertebral artery    75%    Past Surgical History:  Procedure Laterality Date  . ABDOMINAL HYSTERECTOMY  1988   partial, per patient  . BREAST LUMPECTOMY WITH RADIOACTIVE SEED AND SENTINEL LYMPH NODE BIOPSY Right 05/24/2019   Procedure: RADIOACTIVE SEED GUIDED RIGHT BREAST LUMPECTOMY, RIGHT AXIALLARY SENTINEL LYMPH NODE BIOPSY;  Surgeon: Rolm Bookbinder, MD;  Location: [place];  Service: General;  Laterality: Right;  . cornary artery stents     2  . TONSILLECTOMY      Current Medications: No outpatient medications have been marked as taking for the 09/26/19 encounter (Appointment) with Belva Crome, MD.     Allergies:   Dristan cold [chlorphen-pe-acetaminophen] and Kiwi extract   Social History   Socioeconomic History  . Marital status: Divorced    Spouse name: Not on file  . Number of children: Not on file  . Years  of education: Not on file  . Highest education level: Not on file  Occupational History  . Not on file  Tobacco Use  . Smoking status: Former Research scientist (life sciences)  . Smokeless tobacco: Never Used  Vaping Use  . Vaping Use: Never used  Substance and Sexual Activity  . Alcohol use: Yes    Alcohol/week: 0.0 standard drinks    Comment: social  . Drug use: No  . Sexual activity: Not on file  Other Topics Concern  . Not on file  Social History Narrative  . Not on file   Social Determinants of Health   Financial Resource Strain:   . Difficulty of Paying Living Expenses:   Food Insecurity:   . Worried About Charity fundraiser in the Last Year:   . Arboriculturist in the Last Year:   Transportation Needs:   . Film/video editor (Medical):   [name] Lack of Transportation (Non-Medical):   Physical Activity:   . Days of Exercise per Week:   . Minutes of Exercise per Session:   Stress:   . Feeling of Stress :   Social Connections:   . Frequency of Communication with Friends and Family:   . Frequency of Social Gatherings with Friends and Family:   . Attends Religious Services:   . Active Member of Clubs or Organizations:   . Attends Archivist Meetings:   [name] Marital Status:      Family  History: The patient's family history includes Alcohol abuse in an other family member; Arthritis in an other family member; Breast cancer in an other family member; Breast cancer (age of onset: 34) in her mother; Coronary artery disease in her brother; Diabetes in some other family members; Hypertension in her mother and another family member; Stroke in her father.  ROS:   Please see the history of present illness.    Her daughter had mammography performed and was found to have breast cancer and she subsequently underwent bilateral mastectomy.  All other systems reviewed and are negative.  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    Wt Readings from Last 3 Encounters:  07/11/19 139 lb (63 kg)  05/24/19 141 lb 8.6 oz (64.2 kg)  05/04/19 137 lb (62.1 kg)     GEN: Healthy-appearing. No acute distress HEENT: Normal NECK: No JVD. LYMPHATICS: No lymphadenopathy CARDIAC:  RRR without murmur, gallop, or edema. VASCULAR: \ Normal Pulses. No bruits. RESPIRATORY:  Clear to auscultation without rales, wheezing or rhonchi  ABDOMEN: Soft, non-tender, non-distended, No pulsatile mass, MUSCULOSKELETAL: No deformity  SKIN: Warm and dry NEUROLOGIC:  Alert and oriented x 3 PSYCHIATRIC:  Normal affect   ASSESSMENT:    1. Atherosclerosis of native coronary artery of native heart with angina pectoris ([place])   2. Essential hypertension   3. Other hyperlipidemia   4. Diabetes mellitus without complication ([place])   5. Cerebrovascular disease   6. Educated about COVID-19 virus infection    PLAN:    In order of problems listed above:  1. Secondary prevention discussed.  Pharmacologic Lexiscan Myoview to evaluate for high risk. 2. Excellent blood pressure control.  Continue Zestoretic. 3. Continue high intensity statin therapy with Lipitor 40 mg/day. 4. A1c target less than 7.  Consider adding SGLT2.  Increase physical activity.  Continue Glucophage. 5. Primary prevention. 6. COVID-19 vaccine is been received.  Overall education and awareness concerning primary/secondary risk prevention was discussed in detail: LDL less than 70, hemoglobin A1c less than 7, blood pressure target less than 130/80  mmHg, >150 minutes of moderate aerobic activity per week, avoidance of smoking, weight control (via diet and exercise), and continued surveillance/management of/for obstructive sleep apnea.    Medication Adjustments/Labs and Tests Ordered: Current medicines are reviewed at length with the patient today.  Concerns regarding medicines are outlined above.  No orders of the defined types were placed in this encounter.  No orders of the defined types were placed in this encounter.   There are no Patient Instructions on file for this visit.   Signed, Sinclair Grooms, MD  09/25/2019 9:02 PM    [facility]

## 2019-09-26 ENCOUNTER — Encounter: Payer: Self-pay | Admitting: Interventional Cardiology

## 2019-09-26 ENCOUNTER — Encounter: Payer: Self-pay | Admitting: *Deleted

## 2019-09-26 ENCOUNTER — Ambulatory Visit: Payer: Medicare PPO | Admitting: Interventional Cardiology

## 2019-09-26 ENCOUNTER — Other Ambulatory Visit: Payer: Self-pay

## 2019-09-26 ENCOUNTER — Telehealth (HOSPITAL_COMMUNITY): Payer: Self-pay | Admitting: *Deleted

## 2019-09-26 VITALS — BP 106/60 | HR 84 | Ht 68.0 in | Wt 137.0 lb

## 2019-09-26 DIAGNOSIS — I25119 Atherosclerotic heart disease of native coronary artery with unspecified angina pectoris: Secondary | ICD-10-CM | POA: Diagnosis not present

## 2019-09-26 DIAGNOSIS — E119 Type 2 diabetes mellitus without complications: Secondary | ICD-10-CM

## 2019-09-26 DIAGNOSIS — I679 Cerebrovascular disease, unspecified: Secondary | ICD-10-CM | POA: Diagnosis not present

## 2019-09-26 DIAGNOSIS — Z7189 Other specified counseling: Secondary | ICD-10-CM | POA: Diagnosis not present

## 2019-09-26 DIAGNOSIS — E7849 Other hyperlipidemia: Secondary | ICD-10-CM | POA: Diagnosis not present

## 2019-09-26 DIAGNOSIS — I10 Essential (primary) hypertension: Secondary | ICD-10-CM

## 2019-09-26 NOTE — Telephone Encounter (Signed)
Patient given detailed instructions per Myocardial Perfusion Study Information Sheet for the test on 10/03/2019 at 0730. Patient notified to arrive 15 minutes early and that it is imperative to arrive on time for appointment to keep from having the test rescheduled.  If you need to cancel or reschedule your appointment, please call the office within 24 hours of your appointment. . Patient verbalized understanding.Margreat Widener, Ranae Palms Patient did not want letter mychart sent

## 2019-09-26 NOTE — Patient Instructions (Signed)
Medication Instructions:  Your physician recommends that you continue on your current medications as directed. Please refer to the Current Medication list given to you today.  *If you need a refill on your cardiac medications before your next appointment, please call your pharmacy*   Lab Work: None If you have labs (blood work) drawn today and your tests are completely normal, you will receive your results only by: . MyChart Message (if you have MyChart) OR . A paper copy in the mail If you have any lab test that is abnormal or we need to change your treatment, we will call you to review the results.   Testing/Procedures: Your physician has requested that you have a lexiscan myoview. For further information please visit [email or web address]. Please follow instruction sheet, as given.    Follow-Up: At [facility], you and your health needs are our priority.  As part of our continuing mission to provide you with exceptional heart care, we have created designated Provider Care Teams.  These Care Teams include your primary Cardiologist (physician) and Advanced Practice Providers (APPs -  Physician Assistants and Nurse Practitioners) who all work together to provide you with the care you need, when you need it.  We recommend signing up for the patient portal called "MyChart".  Sign up information is provided on this After Visit Summary.  MyChart is used to connect with patients for Virtual Visits (Telemedicine).  Patients are able to view lab/test results, encounter notes, upcoming appointments, etc.  Non-urgent messages can be sent to your provider as well.   To learn more about what you can do with MyChart, go to [email or web address].    Your next appointment:   12 month(s)  The format for your next appointment:   In Person  Provider:   You may see Henry W Smith III, MD or one of the following Advanced Practice Providers on your designated Care Team:    Lori Gerhardt, NP  Laura  Ingold, NP  Jill McDaniel, NP    Other Instructions   

## 2019-10-03 ENCOUNTER — Other Ambulatory Visit: Payer: Self-pay

## 2019-10-03 ENCOUNTER — Ambulatory Visit (HOSPITAL_COMMUNITY): Payer: Medicare PPO | Attending: Cardiology

## 2019-10-03 DIAGNOSIS — I25119 Atherosclerotic heart disease of native coronary artery with unspecified angina pectoris: Secondary | ICD-10-CM | POA: Diagnosis not present

## 2019-10-03 DIAGNOSIS — E7849 Other hyperlipidemia: Secondary | ICD-10-CM

## 2019-10-03 DIAGNOSIS — I10 Essential (primary) hypertension: Secondary | ICD-10-CM | POA: Diagnosis not present

## 2019-10-03 LAB — MYOCARDIAL PERFUSION IMAGING
LV dias vol: 62 mL (ref 46–106)
LV sys vol: 21 mL
Peak HR: 109 {beats}/min
Rest HR: 74 {beats}/min
SDS: 0
SRS: 0
SSS: 0
TID: 0.89

## 2019-10-03 MED ORDER — TECHNETIUM TC 99M TETROFOSMIN IV KIT
31.5000 | PACK | Freq: Once | INTRAVENOUS | Status: AC | PRN
  Administered 2019-10-03: 31.5 via INTRAVENOUS
  Filled 2019-10-03: qty 32

## 2019-10-03 MED ORDER — TECHNETIUM TC 99M TETROFOSMIN IV KIT
11.0000 | PACK | Freq: Once | INTRAVENOUS | Status: AC | PRN
  Administered 2019-10-03: 11 via INTRAVENOUS
  Filled 2019-10-03: qty 11

## 2019-10-03 MED ORDER — REGADENOSON 0.4 MG/5ML IV SOLN
0.4000 mg | Freq: Once | INTRAVENOUS | Status: AC
  Administered 2019-10-03: 0.4 mg via INTRAVENOUS

## 2019-10-15 ENCOUNTER — Encounter: Payer: Self-pay | Admitting: Oncology

## 2019-10-26 ENCOUNTER — Ambulatory Visit: Payer: Medicare PPO | Admitting: Interventional Cardiology

## 2019-11-28 ENCOUNTER — Ambulatory Visit: Payer: Medicare PPO | Admitting: Interventional Cardiology

## 2019-12-10 ENCOUNTER — Other Ambulatory Visit: Payer: Self-pay

## 2019-12-10 ENCOUNTER — Ambulatory Visit: Payer: Medicare PPO | Attending: General Surgery

## 2019-12-10 DIAGNOSIS — C50411 Malignant neoplasm of upper-outer quadrant of right female breast: Secondary | ICD-10-CM | POA: Insufficient documentation

## 2019-12-10 DIAGNOSIS — Z170 Estrogen receptor positive status [ER+]: Secondary | ICD-10-CM | POA: Insufficient documentation

## 2019-12-10 NOTE — Therapy (Signed)
[place], [place], [address] Phone: [phone]   Fax:  [phone]  Physical Therapy Treatment  Patient Details  Name: Mackenzie Gibbs MRN: [id number] Date of Birth: 1940-11-22 Referring Provider (PT): Dr. Rolm Bookbinder   Encounter Date: 12/10/2019   PT End of Session - 12/10/19 1003    Visit Number 2   # unchanged due to screen   Number of Visits 2    Date for PT Re-Evaluation 06/27/19    PT Start Time 0949    PT Stop Time 1003    PT Time Calculation (min) 14 min    Activity Tolerance Patient tolerated treatment well    Behavior During Therapy [facility] for tasks assessed/performed           Past Medical History:  Diagnosis Date  . 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 Cerebrovascular disease   . Diabetes mellitus without complication ([place])   . Dry eyes   . Hyperlipidemia   . Hypertension   . Reflux   . Stenosis of left vertebral artery    75%    Past Surgical History:  Procedure Laterality Date  . ABDOMINAL HYSTERECTOMY  1988   partial, per patient  . BREAST LUMPECTOMY WITH RADIOACTIVE SEED AND SENTINEL LYMPH NODE BIOPSY Right 05/24/2019   Procedure: RADIOACTIVE SEED GUIDED RIGHT BREAST LUMPECTOMY, RIGHT AXIALLARY SENTINEL LYMPH NODE BIOPSY;  Surgeon: Rolm Bookbinder, MD;  Location: [place] [facility];  Service: General;  Laterality: Right;  . cornary artery stents     2  . TONSILLECTOMY      There were no vitals filed for this visit.   Subjective Assessment - 12/10/19 0950    Subjective Pt returns for 3 month L-Dex screen.    Pertinent History Patient was diagnosed on 04/13/2019 with right invasive lobular carcinoma breast cancer. Patient underwent a right lumpectomy and sentinel node biopsy (3 negative nodes removed) on 05/24/2019. It is ER/PR positive and HER2 negative with a Ki67 of  25%.                  L-DEX FLOWSHEETS - 12/10/19 0900      L-DEX LYMPHEDEMA SCREENING   BASELINE SCORE (UNILATERAL) -3.5    L-DEX SCORE (UNILATERAL) -1.8    VALUE CHANGE (UNILAT) 1.7                                  PT Long Term Goals - 06/07/19 1245      PT LONG TERM GOAL #1   Title Patient will demonstrate she has regained full shoulder ROM and function post operatively compared to baselines.    Time 8    Period Weeks    Status Achieved                 Plan - 12/10/19 1003    Clinical Impression Statement L-Dex screen was within normal limits with a change from baseline of 1.7. No further treatment required at this time except to continue 3 month L-Dex screens.    PT Next Visit Plan Will reassess every 3 months for L-Dex scores    Consulted and Agree with Plan of Care Patient           Patient will benefit from skilled therapeutic intervention in order to improve the following deficits and impairments:  Visit Diagnosis: Malignant neoplasm of upper-outer quadrant of right breast in female, estrogen receptor positive [facility])     Problem List Patient Active Problem List   Diagnosis Date Noted  . Malignant neoplasm of upper-outer quadrant of right breast in female, estrogen receptor positive ([place]) 04/25/2019  . 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 Diabetes mellitus without complication ([place])   . Hypertension   . Hyperlipidemia   . Cerebrovascular disease   . Stenosis of left vertebral artery   . Reflux   . Dry eyes     Otelia Limes, PTA 12/10/2019, 10:09 AM  [place], [place], [address] Phone: [phone]   Fax:  [phone]  Name: Mackenzie Gibbs MRN: [id number] Date of Birth: [birth year]

## 2019-12-20 DIAGNOSIS — I10 Essential (primary) hypertension: Secondary | ICD-10-CM | POA: Diagnosis not present

## 2019-12-20 DIAGNOSIS — Z23 Encounter for immunization: Secondary | ICD-10-CM | POA: Diagnosis not present

## 2019-12-20 DIAGNOSIS — E7800 Pure hypercholesterolemia, unspecified: Secondary | ICD-10-CM | POA: Diagnosis not present

## 2019-12-20 DIAGNOSIS — L659 Nonscarring hair loss, unspecified: Secondary | ICD-10-CM | POA: Diagnosis not present

## 2019-12-20 DIAGNOSIS — E1151 Type 2 diabetes mellitus with diabetic peripheral angiopathy without gangrene: Secondary | ICD-10-CM | POA: Diagnosis not present

## 2020-01-09 ENCOUNTER — Inpatient Hospital Stay: Payer: Medicare PPO | Attending: Oncology

## 2020-01-09 ENCOUNTER — Inpatient Hospital Stay: Payer: Medicare PPO | Admitting: Oncology

## 2020-01-09 ENCOUNTER — Other Ambulatory Visit: Payer: Self-pay

## 2020-01-09 VITALS — BP 125/70 | HR 86 | Temp 98.3°F | Resp 17 | Ht 68.0 in | Wt 135.8 lb

## 2020-01-09 DIAGNOSIS — I25119 Atherosclerotic heart disease of native coronary artery with unspecified angina pectoris: Secondary | ICD-10-CM

## 2020-01-09 DIAGNOSIS — C50411 Malignant neoplasm of upper-outer quadrant of right female breast: Secondary | ICD-10-CM

## 2020-01-09 DIAGNOSIS — Z79811 Long term (current) use of aromatase inhibitors: Secondary | ICD-10-CM | POA: Diagnosis not present

## 2020-01-09 DIAGNOSIS — Z79899 Other long term (current) drug therapy: Secondary | ICD-10-CM | POA: Insufficient documentation

## 2020-01-09 DIAGNOSIS — Z170 Estrogen receptor positive status [ER+]: Secondary | ICD-10-CM

## 2020-01-09 DIAGNOSIS — I10 Essential (primary) hypertension: Secondary | ICD-10-CM | POA: Insufficient documentation

## 2020-01-09 DIAGNOSIS — Z87891 Personal history of nicotine dependence: Secondary | ICD-10-CM | POA: Insufficient documentation

## 2020-01-09 DIAGNOSIS — E119 Type 2 diabetes mellitus without complications: Secondary | ICD-10-CM | POA: Diagnosis not present

## 2020-01-09 DIAGNOSIS — I6502 Occlusion and stenosis of left vertebral artery: Secondary | ICD-10-CM

## 2020-01-09 DIAGNOSIS — E782 Mixed hyperlipidemia: Secondary | ICD-10-CM

## 2020-01-09 LAB — CBC WITH DIFFERENTIAL/PLATELET
Abs Immature Granulocytes: 0.03 10*3/uL (ref 0.00–0.07)
Basophils Absolute: 0 10*3/uL (ref 0.0–0.1)
Basophils Relative: 0 %
Eosinophils Absolute: 0.3 10*3/uL (ref 0.0–0.5)
Eosinophils Relative: 4 %
HCT: 41.5 % (ref 36.0–46.0)
Hemoglobin: 13.1 g/dL (ref 12.0–15.0)
Immature Granulocytes: 0 %
Lymphocytes Relative: 31 %
Lymphs Abs: 2.1 10*3/uL (ref 0.7–4.0)
MCH: 28 pg (ref 26.0–34.0)
MCHC: 31.6 g/dL (ref 30.0–36.0)
MCV: 88.7 fL (ref 80.0–100.0)
Monocytes Absolute: 0.4 10*3/uL (ref 0.1–1.0)
Monocytes Relative: 6 %
Neutro Abs: 3.9 10*3/uL (ref 1.7–7.7)
Neutrophils Relative %: 59 %
Platelets: 217 10*3/uL (ref 150–400)
RBC: 4.68 MIL/uL (ref 3.87–5.11)
RDW: 14.7 % (ref 11.5–15.5)
WBC: 6.7 10*3/uL (ref 4.0–10.5)
nRBC: 0 % (ref 0.0–0.2)

## 2020-01-09 LAB — COMPREHENSIVE METABOLIC PANEL
ALT: 18 U/L (ref 0–44)
AST: 16 U/L (ref 15–41)
Albumin: 3.9 g/dL (ref 3.5–5.0)
Alkaline Phosphatase: 74 U/L (ref 38–126)
Anion gap: 4 — ABNORMAL LOW (ref 5–15)
BUN: 18 mg/dL (ref 8–23)
CO2: 32 mmol/L (ref 22–32)
Calcium: 9.6 mg/dL (ref 8.9–10.3)
Chloride: 106 mmol/L (ref 98–111)
Creatinine, Ser: 1.03 mg/dL — ABNORMAL HIGH (ref 0.44–1.00)
GFR, Estimated: 52 mL/min — ABNORMAL LOW (ref >60)
Glucose, Bld: 195 mg/dL — ABNORMAL HIGH (ref 70–99)
Potassium: 3.9 mmol/L (ref 3.5–5.1)
Sodium: 142 mmol/L (ref 135–145)
Total Bilirubin: 0.3 mg/dL (ref 0.3–1.2)
Total Protein: 7.1 g/dL (ref 6.5–8.1)

## 2020-01-09 NOTE — Progress Notes (Signed)
[place]  [phone] [phone]     ID: Mackenzie Gibbs DOB: Jan 11, 1941  MR#: [id number]  DUK#:[id number]  Patient Care Team: Lavone Orn, MD as PCP - General (Internal Medicine) Belva Crome, MD as PCP - Cardiology (Cardiology) Rockwell Germany, RN as Oncology Nurse Navigator Mauro Kaufmann, RN as Oncology Nurse Navigator Rolm Bookbinder, MD as Consulting Physician (General Surgery) Parilee Hally, Virgie Dad, MD as Consulting Physician (Oncology) Kyung Rudd, MD as Consulting Physician (Radiation Oncology) Belva Crome, MD as Consulting Physician (Cardiology) Jola Schmidt, MD as Consulting Physician (Ophthalmology) Chauncey Cruel, MD OTHER MD:  CHIEF COMPLAINT: estrogen receptor positive breast cancer  CURRENT TREATMENT: Anastrozole   INTERVAL HISTORY: Mackenzie "EdnaB" returns today for follow up of her estrogen receptor positive breast cancer.   She was started on anastrozole at her last visit on 07/11/2019.  She is tolerating this remarkably well, with no significant problems with hot flashes or vaginal dryness.  In fact she tells me it has increased her libido.  Since her last visit, she has not undergone any additional studies. She will be due for annual mammography in 03/2020, her first since her diagnosis.   REVIEW OF SYSTEMS: Mackenzie "EdnaB" just returned from several family trips to [place] and [place].  She seldom exercises just to exercise but she is constantly active she says.  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  A detailed review of systems today was otherwise stable   HISTORY OF CURRENT ILLNESS: From the original intake note:  Mackenzie Gibbs had routine screening mammography on 04/13/2019 showing a possible abnormality in the right breast. She underwent right diagnostic mammography with tomography and right breast ultrasonography at [facility] on 04/19/2019 showing: breast density category  B; suspicious 0.6 cm right breast mass at 10 o'clock; no suspicious-appearing lymph nodes.  Accordingly on 04/20/2019 she proceeded to biopsy of the right breast area in question. The pathology from this procedure ([id number]) showed: invasive mammary carcinoma with lobular features, grade 2, e-cadherin weakly positive.  (This was presented as lobular on conference 05/02/2019).  Prognostic indicators significant for: estrogen receptor, 90% positive and progesterone receptor, 95% positive, both with strong staining intensity. Proliferation marker Ki67 at 25%. HER2 negative by immunohistochemistry (1+).  The patient's subsequent history is as detailed below.   PAST MEDICAL HISTORY: Past Medical History:  Diagnosis Date  . 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 Cerebrovascular disease   . Diabetes mellitus without complication ([place])   . Dry eyes   . Hyperlipidemia   . Hypertension   . Reflux   . Stenosis of left vertebral artery    75%    PAST SURGICAL HISTORY: Past Surgical History:  Procedure Laterality Date  . ABDOMINAL HYSTERECTOMY  1988   partial, per patient  . BREAST LUMPECTOMY WITH RADIOACTIVE SEED AND SENTINEL LYMPH NODE BIOPSY Right 05/24/2019   Procedure: RADIOACTIVE SEED GUIDED RIGHT BREAST LUMPECTOMY, RIGHT AXIALLARY SENTINEL LYMPH NODE BIOPSY;  Surgeon: Rolm Bookbinder, MD;  Location: [facility];  Service: General;  Laterality: Right;  . cornary artery stents     2  . TONSILLECTOMY      FAMILY HISTORY: Family History  Problem Relation Age of Onset  . Stroke Father   . Hypertension Mother   . Breast cancer Mother 20  . Diabetes Other   . Hypertension Other   . Arthritis  Other   . Breast cancer Other   . Diabetes Other   . Coronary artery disease Brother   . Alcohol abuse Other    Patient's father was 13 years old when he died from cerebral hemorrhage. Patient's  mother died from breast cancer at age 51. She was diagnosed at age 47. The patient denies a family hx of ovarian cancer. She does note a maternal aunt with "stomach" cancer. She has 1 brother.   GYNECOLOGIC HISTORY:  No LMP recorded. Patient is postmenopausal. Menarche: 79 years old Age at first live birth: 79 years old [place] P 3 LMP age 58 due to hysterectomy Contraceptive: never used HRT never used  Hysterectomy? Yes, for uterine fibroids BSO? no   SOCIAL HISTORY: (updated 04/2019)  Mackenzie Simmers "EdnaB" is currently retired from working as a Armed forces technical officer. 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 She is divorced. She lives at home by herself, with no pets [name], age 32, is an Sales executive in [place], [place]. [name], age 53, is a podiatrist in [place], [place]. [name], age 56, is an event planner in [place]. The patient has two grandchildren and one great-grandchild. She attends [employer] DIRECTIVES: Not addressed   HEALTH MAINTENANCE: Social History   Tobacco Use  . Smoking status: Former Research scientist (life sciences)  . Smokeless tobacco: Never Used  Vaping Use  . Vaping Use: Never used  Substance Use Topics  . Alcohol use: Yes    Alcohol/week: 0.0 standard drinks    Comment: social  . Drug use: No     Colonoscopy: 2017  PAP: none on file  Bone density: date unknown   Allergies  Allergen Reactions  . Dristan Cold [Chlorphen-Pe-Acetaminophen]     EYES PUFFY  . Kiwi Extract     DEATHLY ILL    Current Outpatient Medications  Medication Sig Dispense Refill  . anastrozole (ARIMIDEX) 1 MG tablet Take 1 tablet (1 mg total) by mouth daily. 90 tablet 4  . aspirin EC 81 MG tablet Take 81 mg by mouth daily.    [name] atorvastatin (LIPITOR) 40 MG tablet Take 40 mg by mouth daily.    . beta carotene w/minerals (OCUVITE) tablet Take 1 tablet by mouth  daily.    . cycloSPORINE (RESTASIS) 0.05 % ophthalmic emulsion Place 1 drop into both eyes daily.     [name] glucose blood test strip 1 each by Other route as needed for other. Use as instructed    . Lancets 30G MISC by Does not apply route. ONE TOUCH ULTRA 250.00    . lisinopril-hydrochlorothiazide (PRINZIDE,ZESTORETIC) 10-12.5 MG per tablet Take 1 tablet by mouth daily.    . metFORMIN (GLUCOPHAGE) 1000 MG tablet Take 1,000 mg by mouth 2 (two) times daily.    . nitroGLYCERIN (NITROSTAT) 0.4 MG SL tablet Place 1 tablet (0.4 mg total) under the tongue every 5 (five) minutes as needed for chest pain. 25 tablet 1   No current facility-administered medications for this visit.    OBJECTIVE:  African-American woman in no acute distress  Vitals:   01/09/20 1519  BP: 125/70  Pulse: 86  Resp: 17  Temp: 98.3 F (36.8 C)  SpO2: 96%     Body mass index is 20.65 kg/m.   Wt Readings from Last 3 Encounters:  01/09/20 135 lb 12.8 oz (61.6 kg)  10/03/19  137 lb (62.1 kg)  09/26/19 137 lb (62.1 kg)      ECOG FS:1 - Symptomatic but completely ambulatory  Sclerae unicteric, EOMs intact Wearing a mask No cervical or supraclavicular adenopathy Lungs no rales or rhonchi Heart regular rate and rhythm Abd soft, nontender, positive bowel sounds MSK no focal spinal tenderness, no upper extremity lymphedema Neuro: nonfocal, well oriented, appropriate affect Breasts: The right breast is status post lumpectomy.  The left breast is unremarkable.  Both axillae are benign.   LAB RESULTS:  CMP     Component Value Date/Time   NA 142 01/09/2020 1437   K 3.9 01/09/2020 1437   CL 106 01/09/2020 1437   CO2 32 01/09/2020 1437   GLUCOSE 195 (H) 01/09/2020 1437   BUN 18 01/09/2020 1437   CREATININE 1.03 (H) 01/09/2020 1437   CREATININE 0.93 05/02/2019 1221   CALCIUM 9.6 01/09/2020 1437   PROT 7.1 01/09/2020 1437   ALBUMIN 3.9 01/09/2020 1437   AST 16 01/09/2020 1437   AST 14 (L) 05/02/2019 1221   ALT 18  01/09/2020 1437   ALT 17 05/02/2019 1221   ALKPHOS 74 01/09/2020 1437   BILITOT 0.3 01/09/2020 1437   BILITOT 0.3 05/02/2019 1221   GFRNONAA 52 (L) 01/09/2020 1437   GFRNONAA 59 (L) 05/02/2019 1221   GFRAA >60 07/11/2019 1340   GFRAA >60 05/02/2019 1221    No results found for: TOTALPROTELP, ALBUMINELP, A1GS, A2GS, BETS, BETA2SER, GAMS, MSPIKE, SPEI  Lab Results  Component Value Date   WBC 6.7 01/09/2020   NEUTROABS 3.9 01/09/2020   HGB 13.1 01/09/2020   HCT 41.5 01/09/2020   MCV 88.7 01/09/2020   PLT 217 01/09/2020    No results found for: LABCA2  No components found for: [id number]  No results for input(s): INR in the last 168 hours.  No results found for: LABCA2  No results found for: MNO177  No results found for: NHA579  No results found for: UXY333  No results found for: CA2729  No components found for: HGQUANT  No results found for: CEA1 / No results found for: CEA1   No results found for: AFPTUMOR  No results found for: CHROMOGRNA  No results found for: KPAFRELGTCHN, LAMBDASER, KAPLAMBRATIO (kappa/lambda light chains)  No results found for: HGBA, HGBA2QUANT, HGBFQUANT, HGBSQUAN (Hemoglobinopathy evaluation)   No results found for: LDH  No results found for: IRON, TIBC, IRONPCTSAT (Iron and TIBC)  No results found for: FERRITIN  Urinalysis No results found for: COLORURINE, APPEARANCEUR, LABSPEC, PHURINE, GLUCOSEU, HGBUR, BILIRUBINUR, KETONESUR, PROTEINUR, UROBILINOGEN, NITRITE, LEUKOCYTESUR   STUDIES: No results found.   ELIGIBLE FOR AVAILABLE RESEARCH PROTOCOL: no  ASSESSMENT: 79 y.o. [place] woman status post right breast upper outer quadrant biopsy 04/20/2019 for a clinical T1b N0, stage IA invasive lobular carcinoma (E-cadherin weakly positive), grade 2, estrogen and progesterone receptor positive, HER-2 not amplified, with an MIB-1 of 25%  (1) status post right lumpectomy and sentinel lymph node sampling 05/24/2019 for a pT1C pN0,  stage 1A invasive lobular breast cancer, grade 2, with negative margins  (a) a total of 3 axillary lymph nodes were removed  (2) Oncotype score of 16 predicts a risk of recurrence outside the breast in the next 9 years of 4% if the patient's only systemic therapy is antiestrogens for 5 years  (3) patient opted against adjuvant radiation  (4) anastrozole started 07/11/2019  (a) bone density January 2022   PLAN: Jump is now 8 months out from definitive surgery.  There is  no evidence of disease recurrence or activity.  This is favorable.  She is tolerating anastrozole remarkably well.  The plan will be to continue that a total of 5 years.  I am setting her up for a bone density in January at the same time as her mammogram.  Otherwise I will plan to see her in February.  She knows to call for any other issues that may develop before that visit  Total encounter time 25 minutes.Chauncey Cruel, MD   01/09/2020 5:10 PM Medical Oncology and Hematology [facility] [address] [place], [place] [address] Tel. [phone]    Fax. [phone]   This document serves as a record of services personally performed by Lurline Del, MD. It was created on his behalf by Wilburn Mylar, a trained medical scribe. The creation of this record is based on the scribe's personal observations and the provider's statements to them.   I, Lurline Del MD, have reviewed the above documentation for accuracy and completeness, and I agree with the above.   *Total Encounter Time as defined by the Centers for Medicare and Medicaid Services includes, in addition to the face-to-face time of a patient visit (documented in the note above) non-face-to-face time: obtaining and reviewing outside history, ordering and reviewing medications, tests or procedures, care coordination (communications with other health care professionals or caregivers) and documentation in the medical record.

## 2020-01-15 ENCOUNTER — Encounter (HOSPITAL_COMMUNITY): Payer: Self-pay

## 2020-01-30 DIAGNOSIS — Z20822 Contact with and (suspected) exposure to covid-19: Secondary | ICD-10-CM | POA: Diagnosis not present

## 2020-01-31 ENCOUNTER — Ambulatory Visit: Payer: Medicare PPO | Attending: Internal Medicine

## 2020-01-31 DIAGNOSIS — Z23 Encounter for immunization: Secondary | ICD-10-CM

## 2020-01-31 NOTE — Progress Notes (Signed)
   Covid-[facility] Vaccination Clinic  Name:  Mackenzie Gibbs    MRN: [id number] DOB: [birth year]  01/31/2020  Mackenzie Gibbs was observed post Covid-19 immunization for 15 minutes without incident. She was provided with Vaccine Information Sheet and instruction to access the V-Safe system.   Mackenzie Gibbs was instructed to call 911 with any severe reactions post vaccine: [name] Difficulty breathing  . Swelling of face and throat  . A fast heartbeat  . A bad rash all over body  . Dizziness and weakness

## 2020-02-05 DIAGNOSIS — Z20822 Contact with and (suspected) exposure to covid-19: Secondary | ICD-10-CM | POA: Diagnosis not present

## 2020-02-12 DIAGNOSIS — Z20822 Contact with and (suspected) exposure to covid-19: Secondary | ICD-10-CM | POA: Diagnosis not present

## 2020-02-25 DIAGNOSIS — Z20822 Contact with and (suspected) exposure to covid-19: Secondary | ICD-10-CM | POA: Diagnosis not present

## 2020-03-11 DIAGNOSIS — Z20822 Contact with and (suspected) exposure to covid-19: Secondary | ICD-10-CM | POA: Diagnosis not present

## 2020-04-09 ENCOUNTER — Other Ambulatory Visit: Payer: Medicare PPO

## 2020-04-09 DIAGNOSIS — Z20822 Contact with and (suspected) exposure to covid-19: Secondary | ICD-10-CM | POA: Diagnosis not present

## 2020-04-11 LAB — SPECIMEN STATUS REPORT

## 2020-04-11 LAB — NOVEL CORONAVIRUS, NAA: SARS-CoV-2, NAA: NOT DETECTED

## 2020-04-11 LAB — SARS-COV-2, NAA 2 DAY TAT

## 2020-04-16 ENCOUNTER — Ambulatory Visit
Admission: RE | Admit: 2020-04-16 | Discharge: 2020-04-16 | Disposition: A | Discharge status: Home / Self Care | Payer: Medicare PPO | Source: Ambulatory Visit | Attending: Oncology | Admitting: Oncology

## 2020-04-16 ENCOUNTER — Other Ambulatory Visit: Payer: Self-pay

## 2020-04-16 DIAGNOSIS — C50411 Malignant neoplasm of upper-outer quadrant of right female breast: Secondary | ICD-10-CM | POA: Diagnosis not present

## 2020-04-16 DIAGNOSIS — Z170 Estrogen receptor positive status [ER+]: Secondary | ICD-10-CM

## 2020-04-17 DIAGNOSIS — I2510 Atherosclerotic heart disease of native coronary artery without angina pectoris: Secondary | ICD-10-CM | POA: Diagnosis not present

## 2020-04-17 DIAGNOSIS — I739 Peripheral vascular disease, unspecified: Secondary | ICD-10-CM | POA: Diagnosis not present

## 2020-04-17 DIAGNOSIS — E1151 Type 2 diabetes mellitus with diabetic peripheral angiopathy without gangrene: Secondary | ICD-10-CM | POA: Diagnosis not present

## 2020-04-17 DIAGNOSIS — E7800 Pure hypercholesterolemia, unspecified: Secondary | ICD-10-CM | POA: Diagnosis not present

## 2020-04-17 DIAGNOSIS — Z0000 Encounter for general adult medical examination without abnormal findings: Secondary | ICD-10-CM | POA: Diagnosis not present

## 2020-04-17 DIAGNOSIS — Z1389 Encounter for screening for other disorder: Secondary | ICD-10-CM | POA: Diagnosis not present

## 2020-04-17 DIAGNOSIS — I10 Essential (primary) hypertension: Secondary | ICD-10-CM | POA: Diagnosis not present

## 2020-04-21 DIAGNOSIS — E119 Type 2 diabetes mellitus without complications: Secondary | ICD-10-CM | POA: Diagnosis not present

## 2020-04-21 DIAGNOSIS — H25013 Cortical age-related cataract, bilateral: Secondary | ICD-10-CM | POA: Diagnosis not present

## 2020-05-07 ENCOUNTER — Other Ambulatory Visit: Payer: Medicare PPO

## 2020-05-07 DIAGNOSIS — Z20822 Contact with and (suspected) exposure to covid-19: Secondary | ICD-10-CM | POA: Diagnosis not present

## 2020-05-08 LAB — SARS-COV-2, NAA 2 DAY TAT

## 2020-05-08 LAB — NOVEL CORONAVIRUS, NAA: SARS-CoV-2, NAA: NOT DETECTED

## 2020-05-14 ENCOUNTER — Inpatient Hospital Stay: Payer: Medicare PPO

## 2020-05-14 ENCOUNTER — Inpatient Hospital Stay: Payer: Medicare PPO | Attending: Oncology | Admitting: Oncology

## 2020-05-14 ENCOUNTER — Other Ambulatory Visit: Payer: Self-pay

## 2020-05-14 VITALS — BP 122/78 | HR 70 | Temp 97.3°F | Resp 18 | Ht 68.0 in | Wt 136.2 lb

## 2020-05-14 DIAGNOSIS — Z170 Estrogen receptor positive status [ER+]: Secondary | ICD-10-CM

## 2020-05-14 DIAGNOSIS — Z87891 Personal history of nicotine dependence: Secondary | ICD-10-CM | POA: Diagnosis not present

## 2020-05-14 DIAGNOSIS — E782 Mixed hyperlipidemia: Secondary | ICD-10-CM

## 2020-05-14 DIAGNOSIS — L659 Nonscarring hair loss, unspecified: Secondary | ICD-10-CM | POA: Diagnosis not present

## 2020-05-14 DIAGNOSIS — I10 Essential (primary) hypertension: Secondary | ICD-10-CM | POA: Insufficient documentation

## 2020-05-14 DIAGNOSIS — Z79810 Long term (current) use of selective estrogen receptor modulators (SERMs): Secondary | ICD-10-CM | POA: Insufficient documentation

## 2020-05-14 DIAGNOSIS — R634 Abnormal weight loss: Secondary | ICD-10-CM | POA: Diagnosis not present

## 2020-05-14 DIAGNOSIS — Z803 Family history of malignant neoplasm of breast: Secondary | ICD-10-CM | POA: Diagnosis not present

## 2020-05-14 DIAGNOSIS — C50411 Malignant neoplasm of upper-outer quadrant of right female breast: Secondary | ICD-10-CM | POA: Diagnosis not present

## 2020-05-14 DIAGNOSIS — E119 Type 2 diabetes mellitus without complications: Secondary | ICD-10-CM

## 2020-05-14 DIAGNOSIS — I6502 Occlusion and stenosis of left vertebral artery: Secondary | ICD-10-CM

## 2020-05-14 DIAGNOSIS — I25119 Atherosclerotic heart disease of native coronary artery with unspecified angina pectoris: Secondary | ICD-10-CM

## 2020-05-14 LAB — CBC WITH DIFFERENTIAL/PLATELET
Abs Immature Granulocytes: 0.01 10*3/uL (ref 0.00–0.07)
Basophils Absolute: 0 10*3/uL (ref 0.0–0.1)
Basophils Relative: 0 %
Eosinophils Absolute: 0.2 10*3/uL (ref 0.0–0.5)
Eosinophils Relative: 4 %
HCT: 41.4 % (ref 36.0–46.0)
Hemoglobin: 13 g/dL (ref 12.0–15.0)
Immature Granulocytes: 0 %
Lymphocytes Relative: 39 %
Lymphs Abs: 2 10*3/uL (ref 0.7–4.0)
MCH: 28.2 pg (ref 26.0–34.0)
MCHC: 31.4 g/dL (ref 30.0–36.0)
MCV: 89.8 fL (ref 80.0–100.0)
Monocytes Absolute: 0.3 10*3/uL (ref 0.1–1.0)
Monocytes Relative: 7 %
Neutro Abs: 2.5 10*3/uL (ref 1.7–7.7)
Neutrophils Relative %: 50 %
Platelets: 210 10*3/uL (ref 150–400)
RBC: 4.61 MIL/uL (ref 3.87–5.11)
RDW: 14.3 % (ref 11.5–15.5)
WBC: 5.1 10*3/uL (ref 4.0–10.5)
nRBC: 0 % (ref 0.0–0.2)

## 2020-05-14 LAB — COMPREHENSIVE METABOLIC PANEL
ALT: 20 U/L (ref 0–44)
AST: 18 U/L (ref 15–41)
Albumin: 4.1 g/dL (ref 3.5–5.0)
Alkaline Phosphatase: 68 U/L (ref 38–126)
Anion gap: 7 (ref 5–15)
BUN: 12 mg/dL (ref 8–23)
CO2: 27 mmol/L (ref 22–32)
Calcium: 9.3 mg/dL (ref 8.9–10.3)
Chloride: 106 mmol/L (ref 98–111)
Creatinine, Ser: 0.92 mg/dL (ref 0.44–1.00)
GFR, Estimated: >60 mL/min (ref >60)
Glucose, Bld: 146 mg/dL — ABNORMAL HIGH (ref 70–99)
Potassium: 4.1 mmol/L (ref 3.5–5.1)
Sodium: 140 mmol/L (ref 135–145)
Total Bilirubin: 0.4 mg/dL (ref 0.3–1.2)
Total Protein: 7.2 g/dL (ref 6.5–8.1)

## 2020-05-14 MED ORDER — TAMOXIFEN CITRATE 20 MG PO TABS: 20.0000 mg | ORAL_TABLET | Freq: Every day | ORAL | 12 refills | Status: AC

## 2020-05-14 NOTE — Progress Notes (Signed)
[place]  [phone] [phone]     ID: Mackenzie Gibbs DOB: 07-16-1940  MR#: [id number]  ASN#:[id number]  Patient Care Team: Lavone Orn, MD as PCP - General (Internal Medicine) Belva Crome, MD as PCP - Cardiology (Cardiology) Rockwell Germany, RN as Oncology Nurse Navigator Mauro Kaufmann, RN as Oncology Nurse Navigator Rolm Bookbinder, MD as Consulting Physician (General Surgery) Magrinat, Virgie Dad, MD as Consulting Physician (Oncology) Kyung Rudd, MD as Consulting Physician (Radiation Oncology) Belva Crome, MD as Consulting Physician (Cardiology) Jola Schmidt, MD as Consulting Physician (Ophthalmology) Chauncey Cruel, MD OTHER MD:  CHIEF COMPLAINT: estrogen receptor positive breast cancer  CURRENT TREATMENT: Transitioning to tamoxifen   INTERVAL HISTORY: Aloma returns today for follow up of her estrogen receptor positive breast cancer.   She was started on anastrozole at her last visit on 07/11/2019.  She is tolerating this remarkably well, with no significant problems with hot flashes or vaginal dryness.  In fact she tells me it has increased her libido.  Since her last visit, she underwent bilateral diagnostic mammography with tomography at [facility] on 04/16/2020 showing: breast density category B; no evidence of malignancy in either breast.  She is scheduled for bone density screening on 05/22/2020.   REVIEW OF SYSTEMS: Socorro is very concerned because her hair has thinned significantly.  This could indeed be due to the anastrozole.  She has lost a little weight as well.  That is more suggestive of thyroid issue.  She is not having diarrhea.  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  She is not otherwise exercising regularly.  A detailed review of systems today was stable   COVID 19 VACCINATION STATUS: fully vaccinated [name]) with  booster 01/2020   HISTORY OF CURRENT ILLNESS: From the original intake note:  Mackenzie Gibbs had routine screening mammography on 04/13/2019 showing a possible abnormality in the right breast. She underwent right diagnostic mammography with tomography and right breast ultrasonography at [facility] on 04/19/2019 showing: breast density category B; suspicious 0.6 cm right breast mass at 10 o'clock; no suspicious-appearing lymph nodes.  Accordingly on 04/20/2019 she proceeded to biopsy of the right breast area in question. The pathology from this procedure ([id number]) showed: invasive mammary carcinoma with lobular features, grade 2, e-cadherin weakly positive.  (This was presented as lobular on conference 05/02/2019).  Prognostic indicators significant for: estrogen receptor, 90% positive and progesterone receptor, 95% positive, both with strong staining intensity. Proliferation marker Ki67 at 25%. HER2 negative by immunohistochemistry (1+).  The patient's subsequent history is as detailed below.   PAST MEDICAL HISTORY: Past Medical History:  Diagnosis Date  . 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 Cerebrovascular disease   . Diabetes mellitus without complication ([place])   . Dry eyes   . Hyperlipidemia   . Hypertension   . Reflux   . Stenosis of left vertebral artery    75%    PAST SURGICAL HISTORY: Past Surgical History:  Procedure Laterality Date  . ABDOMINAL HYSTERECTOMY  1988   partial, per patient  . BREAST LUMPECTOMY WITH RADIOACTIVE SEED AND SENTINEL LYMPH NODE BIOPSY Right 05/24/2019   Procedure: RADIOACTIVE SEED GUIDED RIGHT BREAST LUMPECTOMY, RIGHT AXIALLARY SENTINEL LYMPH NODE BIOPSY;  Surgeon: Rolm Bookbinder, MD;  Location: [facility];  Service: General;  Laterality: Right;  . cornary artery stents     2  . TONSILLECTOMY      FAMILY HISTORY: Family History   Problem Relation Age of Onset  . Stroke Father   . Hypertension Mother   . Breast cancer Mother 102  . Diabetes Other   . Hypertension Other   . Arthritis Other   . Breast cancer Other   . Diabetes Other   . Coronary artery disease Brother   . Alcohol abuse Other   Patient's father was 36 years old when he died from cerebral hemorrhage. Patient's mother died from breast cancer at age 12. She was diagnosed at age 62. The patient denies a family hx of ovarian cancer. She does note a maternal aunt with "stomach" cancer. She has 1 brother.   GYNECOLOGIC HISTORY:  No LMP recorded. Patient is postmenopausal. Menarche: 80 years old Age at first live birth: 80 years old [place] P 3 LMP age 10 due to hysterectomy Contraceptive: never used HRT never used  Hysterectomy? Yes, for uterine fibroids BSO? no   SOCIAL HISTORY: (updated 04/2020 Mackenzie Gibbs is currently retired but still working as a Armed forces technical officer. She teaches piano and organ at  [employer] and regularly plays at her church and at funeral. She is divorced. She lives at home by herself, with no pets [name], age 37, is an Sales executive in [place], [place]. [name], age 79, is a podiatrist in [place], [place]. Daughter Felizardo Hoffmann, age 79, is an event planner in [place]. The patient has two grandchildren and one great-grandchild. She attends [employer] DIRECTIVES: Not addressed   HEALTH MAINTENANCE: Social History   Tobacco Use  . Smoking status: Former Research scientist (life sciences)  . Smokeless tobacco: Never Used  Vaping Use  . Vaping Use: Never used  Substance Use Topics  . Alcohol use: Yes    Alcohol/week: 0.0 standard drinks    Comment: social  . Drug use: No     Colonoscopy: 2017  PAP: none on file  Bone density: date unknown   Allergies  Allergen Reactions  . Dristan Cold [Chlorphen-Pe-Acetaminophen]     EYES PUFFY  . Kiwi Extract     DEATHLY ILL    Current Outpatient Medications   Medication Sig Dispense Refill  . tamoxifen (NOLVADEX) 20 MG tablet Take 1 tablet (20 mg total) by mouth daily. Start July 11, 2020 90 tablet 12  . aspirin EC 81 MG tablet Take 81 mg by mouth daily.    [name] atorvastatin (LIPITOR) 40 MG tablet Take 40 mg by mouth daily.    . beta carotene w/minerals (OCUVITE) tablet Take 1 tablet by mouth daily.    . cycloSPORINE (RESTASIS) 0.05 % ophthalmic emulsion Place 1 drop into both eyes daily.     [name] glucose blood test strip 1 each by Other route as needed for other. Use as instructed    . Lancets 30G MISC by Does not apply route. ONE TOUCH ULTRA 250.00    . lisinopril-hydrochlorothiazide (PRINZIDE,ZESTORETIC) 10-12.5 MG per tablet Take 1 tablet by mouth daily.    . metFORMIN (GLUCOPHAGE) 1000 MG tablet Take 1,000 mg by mouth 2 (two) times daily.    . nitroGLYCERIN (NITROSTAT) 0.4 MG SL tablet Place 1 tablet (0.4 mg total) under the tongue every 5 (five) minutes as needed for chest pain. 25 tablet 1   No current facility-administered medications for this visit.  OBJECTIVE:  African-American woman who appears stated age  56:   05/14/20 1407  BP: 122/78  Pulse: 70  Resp: 18  Temp: (!) 97.3 F (36.3 C)  SpO2: 100%     Body mass index is 20.71 kg/m.   Wt Readings from Last 3 Encounters:  05/14/20 136 lb 3.2 oz (61.8 kg)  01/09/20 135 lb 12.8 oz (61.6 kg)  10/03/19 137 lb (62.1 kg)      ECOG FS:1 - Symptomatic but completely ambulatory  Sclerae unicteric, EOMs intact Wearing a mask No cervical or supraclavicular adenopathy Lungs no rales or rhonchi Heart regular rate and rhythm Abd soft, nontender, positive bowel sounds MSK no focal spinal tenderness, no upper extremity lymphedema Neuro: nonfocal, well oriented, appropriate affect Breasts: The right breast is status post lumpectomy with no evidence of residual recurrent disease.  The left breast is benign.  Both axillae are benign.   LAB RESULTS:  CMP     Component Value  Date/Time   NA 142 01/09/2020 1437   K 3.9 01/09/2020 1437   CL 106 01/09/2020 1437   CO2 32 01/09/2020 1437   GLUCOSE 195 (H) 01/09/2020 1437   BUN 18 01/09/2020 1437   CREATININE 1.03 (H) 01/09/2020 1437   CREATININE 0.93 05/02/2019 1221   CALCIUM 9.6 01/09/2020 1437   PROT 7.1 01/09/2020 1437   ALBUMIN 3.9 01/09/2020 1437   AST 16 01/09/2020 1437   AST 14 (L) 05/02/2019 1221   ALT 18 01/09/2020 1437   ALT 17 05/02/2019 1221   ALKPHOS 74 01/09/2020 1437   BILITOT 0.3 01/09/2020 1437   BILITOT 0.3 05/02/2019 1221   GFRNONAA 52 (L) 01/09/2020 1437   GFRNONAA 59 (L) 05/02/2019 1221   GFRAA >60 07/11/2019 1340   GFRAA >60 05/02/2019 1221    No results found for: TOTALPROTELP, ALBUMINELP, A1GS, A2GS, BETS, BETA2SER, GAMS, MSPIKE, SPEI  Lab Results  Component Value Date   WBC 5.1 05/14/2020   NEUTROABS 2.5 05/14/2020   HGB 13.0 05/14/2020   HCT 41.4 05/14/2020   MCV 89.8 05/14/2020   PLT 210 05/14/2020    No results found for: LABCA2  No components found for: KZSWFU932  No results for input(s): INR in the last 168 hours.  No results found for: LABCA2  No results found for: TFT732  No results found for: KGU542  No results found for: HCW237  No results found for: CA2729  No components found for: HGQUANT  No results found for: CEA1 / No results found for: CEA1   No results found for: AFPTUMOR  No results found for: CHROMOGRNA  No results found for: KPAFRELGTCHN, LAMBDASER, KAPLAMBRATIO (kappa/lambda light chains)  No results found for: HGBA, HGBA2QUANT, HGBFQUANT, HGBSQUAN (Hemoglobinopathy evaluation)   No results found for: LDH  No results found for: IRON, TIBC, IRONPCTSAT (Iron and TIBC)  No results found for: FERRITIN  Urinalysis No results found for: COLORURINE, APPEARANCEUR, LABSPEC, PHURINE, GLUCOSEU, HGBUR, BILIRUBINUR, KETONESUR, PROTEINUR, UROBILINOGEN, NITRITE, LEUKOCYTESUR   STUDIES: MM DIAG BREAST TOMO BILATERAL  Result Date:  04/16/2020 CLINICAL DATA:  80 year old female presenting for annual exam. History of right breast cancer in 2021 status post lumpectomy. No new problems. EXAM: DIGITAL DIAGNOSTIC BILATERAL MAMMOGRAM WITH TOMO AND CAD TECHNIQUE: Right digital diagnostic mammography and breast tomosynthesis was performed. Digital images of the breasts were evaluated with computer-aided detection. COMPARISON:  Previous exam(s). ACR Breast Density Category b: There are scattered areas of fibroglandular density. FINDINGS: Right breast: A spot 2D magnification view of the lumpectomy  site was performed in addition to standard views. There are expected postsurgical changes at the lumpectomy site. No suspicious mass, distortion, or microcalcifications are identified to suggest presence of malignancy. Left breast: No suspicious mass, distortion, or microcalcifications are identified to suggest presence of malignancy. IMPRESSION: Expected postsurgical changes in the right breast. No mammographic evidence of malignancy bilaterally. RECOMMENDATION: Diagnostic mammogram is suggested in 1 year. (Code:[id number]-B-01Y) I have discussed the findings and recommendations with the patient. If applicable, a reminder letter will be sent to the patient regarding the next appointment. BI-RADS CATEGORY  2: Benign. Electronically Signed   By: Audie Pinto M.D.   On: 04/16/2020 12:07     ELIGIBLE FOR AVAILABLE RESEARCH PROTOCOL: no  ASSESSMENT: 80 y.o. [place] woman status post right breast upper outer quadrant biopsy 04/20/2019 for a clinical T1b N0, stage IA invasive lobular carcinoma (E-cadherin weakly positive), grade 2, estrogen and progesterone receptor positive, HER-2 not amplified, with an MIB-1 of 25%  (1) status post right lumpectomy and sentinel lymph node sampling 05/24/2019 for a pT1C pN0, stage 1A invasive lobular breast cancer, grade 2, with negative margins  (a) a total of 3 axillary lymph nodes were removed  (2) Oncotype score of  16 predicts a risk of recurrence outside the breast in the next 9 years of 4% if the patient's only systemic therapy is antiestrogens for 5 years  (3) patient opted against adjuvant radiation  (4) anastrozole started 07/11/2019, discontinued March 2022 with hair loss  (a) bone density January 2022  (b) to start tamoxifen April 2022   (i) status post hysterectomy   PLAN: Adreonna is now a year out from definitive surgery for her breast cancer with no evidence of disease recurrence.  This is favorable.  She tolerates anastrozole well except for the fact that her hair has significantly thinned.  I think anastrozole is likely to be the cause but she has also lost a little weight so possibly she has a thyroid issue.  I am going to go ahead and check her thyroid function next week.  She is due for a bone density later this month.  That may be a second reason for [place] to consider switching to tamoxifen.  We did discuss the possible toxicities side effects and complications of that agent.  Recall that she is status post hysterectomy.  She understands the low but nonzero risk of blood clots on that medication.  Otherwise she will return to see me in August.  She knows to call for any other issue that may develop before the visit  Total encounter time 25 minutes.Chauncey Cruel, MD   05/14/2020 2:39 PM Medical Oncology and H[facility]ology [facility] [place], [place] [address] Tel. [phone]    Fax. [phone]   This document serves as a record of services personally performed by Lurline Del, MD. It was created on his behalf by Wilburn Mylar, a trained medical scribe. The creation of this record is based on the scribe's personal observations and the provider's statements to them.   I, Lurline Del MD, have reviewed the above documentation for accuracy and completeness, and I agree with the above.   *Total Encounter Time as defined by the Centers for  Medicare and Medicaid Services includes, in addition to the face-to-face time of a patient visit (documented in the note above) non-face-to-face time: obtaining and reviewing outside history, ordering and reviewing medications, tests or procedures, care coordination (communications with other health care professionals  or caregivers) and documentation in the medical record.

## 2020-05-15 ENCOUNTER — Telehealth: Payer: Self-pay | Admitting: Oncology

## 2020-05-15 NOTE — Telephone Encounter (Signed)
Scheduled appts per 2/16 los. Pt confirmed appt date and time.  

## 2020-05-20 ENCOUNTER — Inpatient Hospital Stay: Payer: Medicare PPO

## 2020-05-20 ENCOUNTER — Other Ambulatory Visit: Payer: Self-pay

## 2020-05-20 DIAGNOSIS — Z170 Estrogen receptor positive status [ER+]: Secondary | ICD-10-CM | POA: Diagnosis not present

## 2020-05-20 DIAGNOSIS — Z87891 Personal history of nicotine dependence: Secondary | ICD-10-CM | POA: Diagnosis not present

## 2020-05-20 DIAGNOSIS — C50411 Malignant neoplasm of upper-outer quadrant of right female breast: Secondary | ICD-10-CM

## 2020-05-20 DIAGNOSIS — I25119 Atherosclerotic heart disease of native coronary artery with unspecified angina pectoris: Secondary | ICD-10-CM

## 2020-05-20 DIAGNOSIS — E119 Type 2 diabetes mellitus without complications: Secondary | ICD-10-CM

## 2020-05-20 DIAGNOSIS — L659 Nonscarring hair loss, unspecified: Secondary | ICD-10-CM | POA: Diagnosis not present

## 2020-05-20 DIAGNOSIS — E782 Mixed hyperlipidemia: Secondary | ICD-10-CM

## 2020-05-20 DIAGNOSIS — I10 Essential (primary) hypertension: Secondary | ICD-10-CM | POA: Diagnosis not present

## 2020-05-20 DIAGNOSIS — Z79810 Long term (current) use of selective estrogen receptor modulators (SERMs): Secondary | ICD-10-CM | POA: Diagnosis not present

## 2020-05-20 DIAGNOSIS — R634 Abnormal weight loss: Secondary | ICD-10-CM | POA: Diagnosis not present

## 2020-05-20 DIAGNOSIS — Z803 Family history of malignant neoplasm of breast: Secondary | ICD-10-CM | POA: Diagnosis not present

## 2020-05-20 DIAGNOSIS — I6502 Occlusion and stenosis of left vertebral artery: Secondary | ICD-10-CM

## 2020-05-20 LAB — CBC WITH DIFFERENTIAL/PLATELET
Abs Immature Granulocytes: 0.02 10*3/uL (ref 0.00–0.07)
Basophils Absolute: 0 10*3/uL (ref 0.0–0.1)
Basophils Relative: 0 %
Eosinophils Absolute: 0.3 10*3/uL (ref 0.0–0.5)
Eosinophils Relative: 4 %
HCT: 40.2 % (ref 36.0–46.0)
Hemoglobin: 12.5 g/dL (ref 12.0–15.0)
Immature Granulocytes: 0 %
Lymphocytes Relative: 37 %
Lymphs Abs: 2.3 10*3/uL (ref 0.7–4.0)
MCH: 27.8 pg (ref 26.0–34.0)
MCHC: 31.1 g/dL (ref 30.0–36.0)
MCV: 89.3 fL (ref 80.0–100.0)
Monocytes Absolute: 0.4 10*3/uL (ref 0.1–1.0)
Monocytes Relative: 6 %
Neutro Abs: 3.2 10*3/uL (ref 1.7–7.7)
Neutrophils Relative %: 53 %
Platelets: 220 10*3/uL (ref 150–400)
RBC: 4.5 MIL/uL (ref 3.87–5.11)
RDW: 14.1 % (ref 11.5–15.5)
WBC: 6.3 10*3/uL (ref 4.0–10.5)
nRBC: 0 % (ref 0.0–0.2)

## 2020-05-20 LAB — COMPREHENSIVE METABOLIC PANEL
ALT: 23 U/L (ref 0–44)
AST: 18 U/L (ref 15–41)
Albumin: 4 g/dL (ref 3.5–5.0)
Alkaline Phosphatase: 70 U/L (ref 38–126)
Anion gap: 9 (ref 5–15)
BUN: 15 mg/dL (ref 8–23)
CO2: 28 mmol/L (ref 22–32)
Calcium: 9.4 mg/dL (ref 8.9–10.3)
Chloride: 102 mmol/L (ref 98–111)
Creatinine, Ser: 0.85 mg/dL (ref 0.44–1.00)
GFR, Estimated: >60 mL/min (ref >60)
Glucose, Bld: 119 mg/dL — ABNORMAL HIGH (ref 70–99)
Potassium: 4 mmol/L (ref 3.5–5.1)
Sodium: 139 mmol/L (ref 135–145)
Total Bilirubin: 0.4 mg/dL (ref 0.3–1.2)
Total Protein: 7 g/dL (ref 6.5–8.1)

## 2020-05-21 LAB — THYROID PANEL WITH TSH
Free Thyroxine Index: 1.8 (ref 1.2–4.9)
T3 Uptake Ratio: 27 % (ref 24–39)
T4, Total: 6.8 ug/dL (ref 4.5–12.0)
TSH: 0.88 u[IU]/mL (ref 0.450–4.500)

## 2020-05-22 ENCOUNTER — Other Ambulatory Visit: Payer: Self-pay

## 2020-05-22 ENCOUNTER — Ambulatory Visit
Admission: RE | Admit: 2020-05-22 | Discharge: 2020-05-22 | Disposition: A | Discharge status: Home / Self Care | Payer: Medicare PPO | Source: Ambulatory Visit | Attending: Oncology | Admitting: Oncology

## 2020-05-22 DIAGNOSIS — Z170 Estrogen receptor positive status [ER+]: Secondary | ICD-10-CM

## 2020-05-22 DIAGNOSIS — Z780 Asymptomatic menopausal state: Secondary | ICD-10-CM | POA: Diagnosis not present

## 2020-05-22 DIAGNOSIS — C50411 Malignant neoplasm of upper-outer quadrant of right female breast: Secondary | ICD-10-CM

## 2020-06-12 DIAGNOSIS — C50411 Malignant neoplasm of upper-outer quadrant of right female breast: Secondary | ICD-10-CM | POA: Diagnosis not present

## 2020-06-16 ENCOUNTER — Other Ambulatory Visit: Payer: Medicare PPO

## 2020-06-16 DIAGNOSIS — Z20822 Contact with and (suspected) exposure to covid-19: Secondary | ICD-10-CM | POA: Diagnosis not present

## 2020-06-19 DIAGNOSIS — Z20822 Contact with and (suspected) exposure to covid-19: Secondary | ICD-10-CM | POA: Diagnosis not present

## 2020-06-26 DIAGNOSIS — Z20822 Contact with and (suspected) exposure to covid-19: Secondary | ICD-10-CM | POA: Diagnosis not present

## 2020-06-27 ENCOUNTER — Encounter: Payer: Self-pay | Admitting: Oncology

## 2020-07-08 DIAGNOSIS — Z20822 Contact with and (suspected) exposure to covid-19: Secondary | ICD-10-CM | POA: Diagnosis not present

## 2020-08-14 DIAGNOSIS — M436 Torticollis: Secondary | ICD-10-CM | POA: Diagnosis not present

## 2020-08-14 DIAGNOSIS — I739 Peripheral vascular disease, unspecified: Secondary | ICD-10-CM | POA: Diagnosis not present

## 2020-08-14 DIAGNOSIS — I10 Essential (primary) hypertension: Secondary | ICD-10-CM | POA: Diagnosis not present

## 2020-08-14 DIAGNOSIS — E1151 Type 2 diabetes mellitus with diabetic peripheral angiopathy without gangrene: Secondary | ICD-10-CM | POA: Diagnosis not present

## 2020-08-14 DIAGNOSIS — M65311 Trigger thumb, right thumb: Secondary | ICD-10-CM | POA: Diagnosis not present

## 2020-08-19 DIAGNOSIS — M542 Cervicalgia: Secondary | ICD-10-CM | POA: Diagnosis not present

## 2020-08-20 ENCOUNTER — Ambulatory Visit: Payer: Medicare PPO

## 2020-08-20 NOTE — Progress Notes (Signed)
   Covid-19 Vaccination Clinic  Name:  Mackenzie Gibbs    MRN: [id number] DOB: Jan 24, 1941  08/20/2020  [name] was observed post Covid-19 immunization for 15 minutes without incident. She was provided with Vaccine Information Sheet and instruction to access the V-Safe system.   [name] was instructed to call 911 with any severe reactions post vaccine: [name] Difficulty breathing  . Swelling of face and throat  . A fast heartbeat  . A bad rash all over body  . Dizziness and weakness

## 2020-08-21 DIAGNOSIS — M542 Cervicalgia: Secondary | ICD-10-CM | POA: Diagnosis not present

## 2020-08-21 DIAGNOSIS — M436 Torticollis: Secondary | ICD-10-CM | POA: Diagnosis not present

## 2020-08-26 ENCOUNTER — Other Ambulatory Visit (HOSPITAL_BASED_OUTPATIENT_CLINIC_OR_DEPARTMENT_OTHER): Payer: Self-pay

## 2020-08-26 DIAGNOSIS — M436 Torticollis: Secondary | ICD-10-CM | POA: Diagnosis not present

## 2020-08-26 DIAGNOSIS — M542 Cervicalgia: Secondary | ICD-10-CM | POA: Diagnosis not present

## 2020-08-26 MED ORDER — MODERNA COVID-19 VACCINE 100 MCG/0.5ML IM SUSP
INTRAMUSCULAR | 0 refills | Status: AC
  Filled 2020-08-26: qty 0.25, 1d supply, fill #0

## 2020-08-27 ENCOUNTER — Ambulatory Visit: Payer: Medicare PPO

## 2020-08-28 DIAGNOSIS — M436 Torticollis: Secondary | ICD-10-CM | POA: Diagnosis not present

## 2020-08-28 DIAGNOSIS — M542 Cervicalgia: Secondary | ICD-10-CM | POA: Diagnosis not present

## 2020-09-16 DIAGNOSIS — M436 Torticollis: Secondary | ICD-10-CM | POA: Diagnosis not present

## 2020-09-16 DIAGNOSIS — M542 Cervicalgia: Secondary | ICD-10-CM | POA: Diagnosis not present

## 2020-09-18 ENCOUNTER — Other Ambulatory Visit: Payer: Self-pay | Admitting: Oncology

## 2020-09-18 DIAGNOSIS — M436 Torticollis: Secondary | ICD-10-CM | POA: Diagnosis not present

## 2020-09-18 DIAGNOSIS — M542 Cervicalgia: Secondary | ICD-10-CM | POA: Diagnosis not present

## 2020-09-23 DIAGNOSIS — M436 Torticollis: Secondary | ICD-10-CM | POA: Diagnosis not present

## 2020-09-23 DIAGNOSIS — M542 Cervicalgia: Secondary | ICD-10-CM | POA: Diagnosis not present

## 2020-09-25 DIAGNOSIS — M436 Torticollis: Secondary | ICD-10-CM | POA: Diagnosis not present

## 2020-09-25 DIAGNOSIS — M542 Cervicalgia: Secondary | ICD-10-CM | POA: Diagnosis not present

## 2020-09-30 DIAGNOSIS — M542 Cervicalgia: Secondary | ICD-10-CM | POA: Diagnosis not present

## 2020-09-30 DIAGNOSIS — M436 Torticollis: Secondary | ICD-10-CM | POA: Diagnosis not present

## 2020-10-01 DIAGNOSIS — Z20822 Contact with and (suspected) exposure to covid-19: Secondary | ICD-10-CM | POA: Diagnosis not present

## 2020-10-02 DIAGNOSIS — M542 Cervicalgia: Secondary | ICD-10-CM | POA: Diagnosis not present

## 2020-10-02 DIAGNOSIS — M436 Torticollis: Secondary | ICD-10-CM | POA: Diagnosis not present

## 2020-10-16 DIAGNOSIS — M542 Cervicalgia: Secondary | ICD-10-CM | POA: Diagnosis not present

## 2020-10-16 DIAGNOSIS — M436 Torticollis: Secondary | ICD-10-CM | POA: Diagnosis not present

## 2020-10-16 DIAGNOSIS — Z20822 Contact with and (suspected) exposure to covid-19: Secondary | ICD-10-CM | POA: Diagnosis not present

## 2020-10-17 DIAGNOSIS — Z20822 Contact with and (suspected) exposure to covid-19: Secondary | ICD-10-CM | POA: Diagnosis not present

## 2020-10-24 DIAGNOSIS — E119 Type 2 diabetes mellitus without complications: Secondary | ICD-10-CM | POA: Diagnosis not present

## 2020-10-24 DIAGNOSIS — H524 Presbyopia: Secondary | ICD-10-CM | POA: Diagnosis not present

## 2020-10-24 DIAGNOSIS — H25013 Cortical age-related cataract, bilateral: Secondary | ICD-10-CM | POA: Diagnosis not present

## 2020-10-30 DIAGNOSIS — M542 Cervicalgia: Secondary | ICD-10-CM | POA: Diagnosis not present

## 2020-10-30 DIAGNOSIS — M25552 Pain in left hip: Secondary | ICD-10-CM | POA: Diagnosis not present

## 2020-10-31 DIAGNOSIS — Z20822 Contact with and (suspected) exposure to covid-19: Secondary | ICD-10-CM | POA: Diagnosis not present

## 2020-11-06 DIAGNOSIS — M25552 Pain in left hip: Secondary | ICD-10-CM | POA: Diagnosis not present

## 2020-11-06 DIAGNOSIS — M542 Cervicalgia: Secondary | ICD-10-CM | POA: Diagnosis not present

## 2020-11-11 ENCOUNTER — Other Ambulatory Visit: Payer: Self-pay

## 2020-11-11 ENCOUNTER — Inpatient Hospital Stay: Payer: Medicare PPO

## 2020-11-11 ENCOUNTER — Inpatient Hospital Stay: Payer: Medicare PPO | Attending: Oncology | Admitting: Oncology

## 2020-11-11 VITALS — BP 119/77 | HR 74 | Temp 97.7°F | Resp 18 | Ht 68.0 in | Wt 131.9 lb

## 2020-11-11 DIAGNOSIS — I6502 Occlusion and stenosis of left vertebral artery: Secondary | ICD-10-CM

## 2020-11-11 DIAGNOSIS — Z170 Estrogen receptor positive status [ER+]: Secondary | ICD-10-CM | POA: Insufficient documentation

## 2020-11-11 DIAGNOSIS — E782 Mixed hyperlipidemia: Secondary | ICD-10-CM

## 2020-11-11 DIAGNOSIS — Z79899 Other long term (current) drug therapy: Secondary | ICD-10-CM | POA: Diagnosis not present

## 2020-11-11 DIAGNOSIS — C50411 Malignant neoplasm of upper-outer quadrant of right female breast: Secondary | ICD-10-CM | POA: Diagnosis not present

## 2020-11-11 DIAGNOSIS — I25119 Atherosclerotic heart disease of native coronary artery with unspecified angina pectoris: Secondary | ICD-10-CM

## 2020-11-11 DIAGNOSIS — Z79811 Long term (current) use of aromatase inhibitors: Secondary | ICD-10-CM | POA: Diagnosis not present

## 2020-11-11 DIAGNOSIS — E119 Type 2 diabetes mellitus without complications: Secondary | ICD-10-CM

## 2020-11-11 DIAGNOSIS — I10 Essential (primary) hypertension: Secondary | ICD-10-CM

## 2020-11-11 LAB — CBC WITH DIFFERENTIAL/PLATELET
Abs Immature Granulocytes: 0.01 10*3/uL (ref 0.00–0.07)
Basophils Absolute: 0 10*3/uL (ref 0.0–0.1)
Basophils Relative: 1 %
Eosinophils Absolute: 0.3 10*3/uL (ref 0.0–0.5)
Eosinophils Relative: 5 %
HCT: 40.2 % (ref 36.0–46.0)
Hemoglobin: 12.8 g/dL (ref 12.0–15.0)
Immature Granulocytes: 0 %
Lymphocytes Relative: 39 %
Lymphs Abs: 2.4 10*3/uL (ref 0.7–4.0)
MCH: 28.3 pg (ref 26.0–34.0)
MCHC: 31.8 g/dL (ref 30.0–36.0)
MCV: 88.9 fL (ref 80.0–100.0)
Monocytes Absolute: 0.4 10*3/uL (ref 0.1–1.0)
Monocytes Relative: 7 %
Neutro Abs: 2.9 10*3/uL (ref 1.7–7.7)
Neutrophils Relative %: 48 %
Platelets: 246 10*3/uL (ref 150–400)
RBC: 4.52 MIL/uL (ref 3.87–5.11)
RDW: 14.6 % (ref 11.5–15.5)
WBC: 6.1 10*3/uL (ref 4.0–10.5)
nRBC: 0 % (ref 0.0–0.2)

## 2020-11-11 LAB — COMPREHENSIVE METABOLIC PANEL
ALT: 20 U/L (ref 0–44)
AST: 17 U/L (ref 15–41)
Albumin: 4.2 g/dL (ref 3.5–5.0)
Alkaline Phosphatase: 69 U/L (ref 38–126)
Anion gap: 8 (ref 5–15)
BUN: 20 mg/dL (ref 8–23)
CO2: 28 mmol/L (ref 22–32)
Calcium: 9.6 mg/dL (ref 8.9–10.3)
Chloride: 104 mmol/L (ref 98–111)
Creatinine, Ser: 0.91 mg/dL (ref 0.44–1.00)
GFR, Estimated: >60 mL/min (ref >60)
Glucose, Bld: 87 mg/dL (ref 70–99)
Potassium: 4 mmol/L (ref 3.5–5.1)
Sodium: 140 mmol/L (ref 135–145)
Total Bilirubin: 0.4 mg/dL (ref 0.3–1.2)
Total Protein: 7.4 g/dL (ref 6.5–8.1)

## 2020-11-11 NOTE — Progress Notes (Signed)
Mackenzie Gibbs  Telephone:([phone]) [phone] Fax:(336) [phone]     ID: Mackenzie Gibbs DOB: 15-Aug-1940  MR#: [id number]  YYQ#:[id number]  Patient Care Team: Mackenzie Orn, MD as PCP - General (Internal Medicine) Mackenzie Crome, MD as PCP - Cardiology (Cardiology) Mackenzie Germany, RN as Oncology Nurse Navigator Mackenzie Kaufmann, RN as Oncology Nurse Navigator Mackenzie Bookbinder, MD as Consulting Physician (General Surgery) Mackenzie Gibbs, Mackenzie Dad, MD as Consulting Physician (Oncology) Mackenzie Rudd, MD as Consulting Physician (Radiation Oncology) Mackenzie Crome, MD as Consulting Physician (Cardiology) Mackenzie Schmidt, MD as Consulting Physician (Ophthalmology) Mackenzie Cruel, MD OTHER MD:  CHIEF COMPLAINT: estrogen receptor positive breast cancer  CURRENT TREATMENT: Anastrozole   INTERVAL HISTORY: Mackenzie Gibbs returns today for follow up of her estrogen receptor positive breast cancer.   She had some hair loss with anastrozole and considered switching to tamoxifen April 2022.[name]  However after she got more information and learned about the possible blood clot she decided anastrozole was the better bit.  She will be due for repeat mammography January 2023.  Bone density screening on 05/22/2020 shows a T score of -1.0 (normal).[name]   REVIEW OF SYSTEMS: Mackenzie Gibbs has been having family stay with her while they purchased a new home.  She has been enjoying her great granddaughter who is 17 months but caring her around is causing her some back pain.  Aside from that a detailed review of systems was noncontributory.   COVID 19 VACCINATION STATUS: fully vaccinated Mackenzie Gibbs) with booster x2 as of August 2022  HISTORY OF CURRENT ILLNESS: From the original intake note:  Mackenzie Gibbs had routine screening mammography on 04/13/2019 showing a possible abnormality in the right breast. She underwent right diagnostic mammography with tomography and right breast ultrasonography at [facility] on 04/19/2019 showing: breast density category B; suspicious 0.6 cm right breast mass at 10 o'clock; no suspicious-appearing lymph nodes.  Accordingly on 04/20/2019 she proceeded to biopsy of the right breast area in question. The pathology from this procedure ([id number]) showed: invasive mammary carcinoma with lobular features, grade 2, e-cadherin weakly positive.  (This was presented as lobular on conference 05/02/2019).  Prognostic indicators significant for: estrogen receptor, 90% positive and progesterone receptor, 95% positive, both with strong staining intensity. Proliferation marker Ki67 at 25%. HER2 negative by immunohistochemistry (1+).  The patient's subsequent history is as detailed below.   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 ([place]) 2004   WITH RCA AND CIRCUMFLEX TAXUS DES   Cerebrovascular disease    Diabetes mellitus without complication (HCC)    Dry eyes    Hyperlipidemia    Hypertension    Reflux    Stenosis of left vertebral artery    75%    PAST SURGICAL HISTORY: Past Surgical History:  Procedure Laterality Date   ABDOMINAL HYSTERECTOMY  1988   partial, per patient   BREAST LUMPECTOMY WITH RADIOACTIVE SEED AND SENTINEL LYMPH NODE BIOPSY Right 05/24/2019   Procedure: RADIOACTIVE SEED GUIDED RIGHT BREAST LUMPECTOMY, RIGHT AXIALLARY SENTINEL LYMPH NODE BIOPSY;  Surgeon: Mackenzie Bookbinder, MD;  Location: [facility];  Service: General;  Laterality: Right;   cornary artery stents     2   TONSILLECTOMY      FAMILY HISTORY: Family History  Problem Relation Age of Onset   Stroke Father    Hypertension Mother    Breast cancer Mother 47   Diabetes Other  Hypertension Other    Arthritis Other    Breast cancer Other    Diabetes Other    Coronary artery disease Brother    Alcohol abuse Other   Patient's father was 90 years old when he died from  cerebral hemorrhage. Patient's mother died from breast cancer at age 75. She was diagnosed at age 89. The patient denies a family hx of ovarian cancer. She does note a maternal aunt with "stomach" cancer. She has 1 brother.   GYNECOLOGIC HISTORY:  No LMP recorded. Patient is postmenopausal. Menarche: 80 years old Age at first live birth: 80 years old [place] P 3 LMP age 73 due to hysterectomy Contraceptive: never used HRT never used  Hysterectomy? Yes, for uterine fibroids BSO? 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 She is divorced. She lives at home by herself, with no pets. [name], age 4, is an Sales executive in [place], [place]. [name] M.D., age 46, is a podiatrist in [place], [place]. [name], age 31, is an event planner in [place]. Mackenzie Gibbs is status post bilateral mastectomies for breast cancer.  The patient has two grandchildren and one great-grandchild. She attends [employer] DIRECTIVES: Not addressed   HEALTH MAINTENANCE: Social History   Tobacco Use   Smoking status: Former   Smokeless tobacco: Never  Vaping Use   Vaping Use: Never used  Substance Use Topics   Alcohol use: Yes    Alcohol/week: 0.0 standard drinks    Comment: social   Drug use: No     Colonoscopy: 2017  PAP: none on file  Bone density: date unknown   Allergies  Allergen Reactions   Dristan Cold [Chlorphen-Pe-Acetaminophen]     EYES PUFFY   Kiwi Extract     DEATHLY ILL    Current Outpatient Medications  Medication Sig Dispense Refill   anastrozole (ARIMIDEX) 1 MG tablet TAKE 1 TABLET(1 MG) BY MOUTH DAILY 90 tablet 4   aspirin EC 81 MG tablet Take 81 mg by mouth daily.     atorvastatin (LIPITOR) 40 MG tablet Take 40 mg by mouth daily.     beta carotene w/minerals (OCUVITE)  tablet Take 1 tablet by mouth daily.     COVID-19 mRNA vaccine, Moderna, (MODERNA COVID-19 VACCINE) 100 MCG/0.5ML injection Inject into the muscle. 0.25 mL 0   cycloSPORINE (RESTASIS) 0.05 % ophthalmic emulsion Place 1 drop into both eyes daily.      glucose blood test strip 1 each by Other route as needed for other. Use as instructed     Lancets 30G MISC by Does not apply route. ONE TOUCH ULTRA 250.00     lisinopril-hydrochlorothiazide (PRINZIDE,ZESTORETIC) 10-12.5 MG per tablet Take 1 tablet by mouth daily.     metFORMIN (GLUCOPHAGE) 1000 MG tablet Take 1,000 mg by mouth 2 (two) times daily.     nitroGLYCERIN (NITROSTAT) 0.4 MG SL tablet Place 1 tablet (0.4 mg total) under the tongue every 5 (five) minutes as needed for chest pain. 25 tablet 1   No current facility-administered medications for this visit.    OBJECTIVE:  African-American woman who appears stated age  22:   11/11/20 1342  BP: 119/77  Pulse: 74  Resp: 18  Temp: 97.7 F (36.5 C)  SpO2: 100%  Body mass index is 20.06 kg/m.   Wt Readings from Last 3 Encounters:  11/11/20 131 lb 14.4 oz (59.8 kg)  05/14/20 136 lb 3.2 oz (61.8 kg)  01/09/20 135 lb 12.8 oz (61.6 kg)      ECOG FS:1 - Symptomatic but completely ambulatory  Sclerae unicteric, EOMs intact Wearing a mask No cervical or supraclavicular adenopathy Lungs no rales or rhonchi Heart regular rate and rhythm Abd soft, nontender, positive bowel sounds MSK no focal spinal tenderness, no upper extremity lymphedema Neuro: nonfocal, well oriented, appropriate affect Breasts: The right breast is status post lumpectomy.  There is with no evidence of residual or recurrent disease.  The left breast is benign.  Both axillae are benign.   LAB RESULTS:  CMP     Component Value Date/Time   NA 139 05/20/2020 1324   K 4.0 05/20/2020 1324   CL 102 05/20/2020 1324   CO2 28 05/20/2020 1324   GLUCOSE 119 (H) 05/20/2020 1324   BUN 15 05/20/2020 1324    CREATININE 0.85 05/20/2020 1324   CREATININE 0.93 05/02/2019 1221   CALCIUM 9.4 05/20/2020 1324   PROT 7.0 05/20/2020 1324   ALBUMIN 4.0 05/20/2020 1324   AST 18 05/20/2020 1324   AST 14 (L) 05/02/2019 1221   ALT 23 05/20/2020 1324   ALT 17 05/02/2019 1221   ALKPHOS 70 05/20/2020 1324   BILITOT 0.4 05/20/2020 1324   BILITOT 0.3 05/02/2019 1221   GFRNONAA >60 05/20/2020 1324   GFRNONAA 59 (L) 05/02/2019 1221   GFRAA >60 07/11/2019 1340   GFRAA >60 05/02/2019 1221    No results found for: TOTALPROTELP, ALBUMINELP, A1GS, A2GS, BETS, BETA2SER, GAMS, MSPIKE, SPEI  Lab Results  Component Value Date   WBC 6.1 11/11/2020   NEUTROABS 2.9 11/11/2020   HGB 12.8 11/11/2020   HCT 40.2 11/11/2020   MCV 88.9 11/11/2020   PLT 246 11/11/2020    No results found for: LABCA2  No components found for: ZOXWRU045  No results for input(s): INR in the last 168 hours.  No results found for: LABCA2  No results found for: WUJ811  No results found for: BJY782  No results found for: NFA213  No results found for: CA2729  No components found for: HGQUANT  No results found for: CEA1 / No results found for: CEA1   No results found for: AFPTUMOR  No results found for: CHROMOGRNA  No results found for: KPAFRELGTCHN, LAMBDASER, KAPLAMBRATIO (kappa/lambda light chains)  No results found for: HGBA, HGBA2QUANT, HGBFQUANT, HGBSQUAN (Hemoglobinopathy evaluation)   No results found for: LDH  No results found for: IRON, TIBC, IRONPCTSAT (Iron and TIBC)  No results found for: FERRITIN  Urinalysis No results found for: COLORURINE, APPEARANCEUR, LABSPEC, PHURINE, GLUCOSEU, HGBUR, BILIRUBINUR, KETONESUR, PROTEINUR, UROBILINOGEN, NITRITE, LEUKOCYTESUR   STUDIES: No results found.    ELIGIBLE FOR AVAILABLE RESEARCH PROTOCOL: no  ASSESSMENT: 80 y.o. [place] woman status post right breast upper outer quadrant biopsy 04/20/2019 for a clinical T1b N0, stage IA invasive lobular carcinoma  (E-cadherin weakly positive), grade 2, estrogen and progesterone receptor positive, HER-2 not amplified, with an MIB-1 of 25%  (1) status post right lumpectomy and sentinel lymph node sampling 05/24/2019 for a pT1C pN0, stage 1A invasive lobular breast cancer, grade 2, with negative margins  (a) a total of 3 axillary lymph nodes were removed  (2) Oncotype score of 16 predicts a risk of recurrence outside the breast in the next 9 years of 4% if the patient's only systemic therapy  is antiestrogens for 5 years  (3) patient opted against adjuvant radiation  (4) anastrozole started 07/11/2019  (a) bone density 05/22/2020 at the [facility] showed a T score of -1.0     PLAN: Kerah is now a year and a half out from definitive surgery for her breast cancer with no evidence of disease recurrence.  This is very favorable.  She considered switching to tamoxifen but the idea of possible blood clots determined to her so she is willing to continue anastrozole to a total of 5 years.  She is taking some biotin which she feels is helping prevent further hair loss.  Today she mentioned that her youngest daughter had breast cancer and underwent bilateral mastectomies.  I do not think we knew that.  The patient's mother did have breast cancer so Gibbs think it now qualifies for genetics testing.  Gibbs have sent a note to our geneticist to check on this.  Gibbs encouraged her to remain as active as possible.  It is wonderful that she continues to play at church almost on a weekly basis as well as on other occasions.  She will have mammography again in January and will return to see [place] in February.  That will be her 2-year mark and from that point we will start seeing her on a once a year basis.  Total encounter time 25 minutes.Mackenzie Cruel, MD   11/11/2020 1:51 PM Medical Oncology and Hematology [facility] [place], [place] [address] Tel. [phone]    Fax.  [phone]   This document serves as a record of services personally performed by Lurline Del, MD. It was created on his behalf by Wilburn Mylar, a trained medical scribe. The creation of this record is based on the scribe's personal observations and the provider's statements to them.   Gibbs, Lurline Del MD, have reviewed the above documentation for accuracy and completeness, and Gibbs agree with the above.   *Total Encounter Time as defined by the Centers for Medicare and Medicaid Services includes, in addition to the face-to-face time of a patient visit (documented in the note above) non-face-to-face time: obtaining and reviewing outside history, ordering and reviewing medications, tests or procedures, care coordination (communications with other health care professionals or caregivers) and documentation in the medical record.

## 2020-11-12 LAB — THYROID PANEL WITH TSH
Free Thyroxine Index: 2.7 (ref 1.2–4.9)
T3 Uptake Ratio: 32 % (ref 24–39)
T4, Total: 8.4 ug/dL (ref 4.5–12.0)
TSH: 0.892 u[IU]/mL (ref 0.450–4.500)

## 2020-11-13 DIAGNOSIS — M542 Cervicalgia: Secondary | ICD-10-CM | POA: Diagnosis not present

## 2020-11-13 DIAGNOSIS — M25552 Pain in left hip: Secondary | ICD-10-CM | POA: Diagnosis not present

## 2020-11-19 DIAGNOSIS — M25552 Pain in left hip: Secondary | ICD-10-CM | POA: Diagnosis not present

## 2020-11-19 DIAGNOSIS — M542 Cervicalgia: Secondary | ICD-10-CM | POA: Diagnosis not present

## 2020-11-26 DIAGNOSIS — M25552 Pain in left hip: Secondary | ICD-10-CM | POA: Diagnosis not present

## 2020-11-26 DIAGNOSIS — M542 Cervicalgia: Secondary | ICD-10-CM | POA: Diagnosis not present

## 2020-12-02 DIAGNOSIS — M1990 Unspecified osteoarthritis, unspecified site: Secondary | ICD-10-CM | POA: Diagnosis not present

## 2020-12-02 DIAGNOSIS — H04129 Dry eye syndrome of unspecified lacrimal gland: Secondary | ICD-10-CM | POA: Diagnosis not present

## 2020-12-02 DIAGNOSIS — I10 Essential (primary) hypertension: Secondary | ICD-10-CM | POA: Diagnosis not present

## 2020-12-02 DIAGNOSIS — E785 Hyperlipidemia, unspecified: Secondary | ICD-10-CM | POA: Diagnosis not present

## 2020-12-02 DIAGNOSIS — Z79811 Long term (current) use of aromatase inhibitors: Secondary | ICD-10-CM | POA: Diagnosis not present

## 2020-12-02 DIAGNOSIS — I2510 Atherosclerotic heart disease of native coronary artery without angina pectoris: Secondary | ICD-10-CM | POA: Diagnosis not present

## 2020-12-02 DIAGNOSIS — C50919 Malignant neoplasm of unspecified site of unspecified female breast: Secondary | ICD-10-CM | POA: Diagnosis not present

## 2020-12-02 DIAGNOSIS — E119 Type 2 diabetes mellitus without complications: Secondary | ICD-10-CM | POA: Diagnosis not present

## 2020-12-02 DIAGNOSIS — Z7982 Long term (current) use of aspirin: Secondary | ICD-10-CM | POA: Diagnosis not present

## 2020-12-03 DIAGNOSIS — M25552 Pain in left hip: Secondary | ICD-10-CM | POA: Diagnosis not present

## 2020-12-03 DIAGNOSIS — M542 Cervicalgia: Secondary | ICD-10-CM | POA: Diagnosis not present

## 2020-12-16 DIAGNOSIS — E1151 Type 2 diabetes mellitus with diabetic peripheral angiopathy without gangrene: Secondary | ICD-10-CM | POA: Diagnosis not present

## 2020-12-16 DIAGNOSIS — R636 Underweight: Secondary | ICD-10-CM | POA: Diagnosis not present

## 2020-12-16 DIAGNOSIS — I10 Essential (primary) hypertension: Secondary | ICD-10-CM | POA: Diagnosis not present

## 2020-12-16 DIAGNOSIS — M65311 Trigger thumb, right thumb: Secondary | ICD-10-CM | POA: Diagnosis not present

## 2020-12-16 DIAGNOSIS — Z23 Encounter for immunization: Secondary | ICD-10-CM | POA: Diagnosis not present

## 2020-12-16 DIAGNOSIS — I2510 Atherosclerotic heart disease of native coronary artery without angina pectoris: Secondary | ICD-10-CM | POA: Diagnosis not present

## 2020-12-17 NOTE — Progress Notes (Signed)
Is Cardiology Office Note:    Date:  12/18/2020   ID:  Mackenzie Gibbs, DOB 1941/02/27, MRN [id number]  PCP:  Lavone Orn, MD  Cardiologist:  Sinclair Grooms, MD   Referring MD: Lavone Orn, MD   Chief Complaint  Patient presents with   Atrial Fibrillation   Coronary Artery Disease    History of Present Illness:    Mackenzie Gibbs is a 80 y.o. female with a hx of diabetes type II, essential hypertension, cerebrovascular disease,  Hyperlipidemia, CAD with prior first-generation DES to circumflex / right coronary artery 2004, and essential hypertension.  No chest discomfort.  Not as physically active as in the past..  She is active but not in terms of aerobic.  Within the constraints of her inactivity she is not having symptoms concerning for angina.  There is no difficulty with dyspnea.  She has occasional right ankle swelling.  This occurs when she drives to [place] or if she drinks a beer.  She has not needed sublingual nitroglycerin.  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 ([place]) 2004   WITH RCA AND CIRCUMFLEX TAXUS DES   Cerebrovascular disease    Diabetes mellitus without complication (HCC)    Dry eyes    Hyperlipidemia    Hypertension    Reflux    Stenosis of left vertebral artery    75%    Past Surgical History:  Procedure Laterality Date   ABDOMINAL HYSTERECTOMY  1988   partial, per patient   BREAST LUMPECTOMY WITH RADIOACTIVE SEED AND SENTINEL LYMPH NODE BIOPSY Right 05/24/2019   Procedure: RADIOACTIVE SEED GUIDED RIGHT BREAST LUMPECTOMY, RIGHT AXIALLARY SENTINEL LYMPH NODE BIOPSY;  Surgeon: Rolm Bookbinder, MD;  Location: [facility];  Service: General;  Laterality: Right;   cornary artery stents     2   TONSILLECTOMY      Current Medications: Current Meds  Medication Sig   anastrozole (ARIMIDEX) 1 MG tablet TAKE 1 TABLET(1 MG) BY MOUTH DAILY   aspirin EC  81 MG tablet Take 81 mg by mouth daily.   atorvastatin (LIPITOR) 40 MG tablet Take 40 mg by mouth daily.   beta carotene w/minerals (OCUVITE) tablet Take 1 tablet by mouth daily.   Biotin 10000 MCG TABS Take 1,000 mg by mouth daily.   COVID-19 mRNA vaccine, Moderna, (MODERNA COVID-19 VACCINE) 100 MCG/0.5ML injection Inject into the muscle.   cycloSPORINE (RESTASIS) 0.05 % ophthalmic emulsion Place 1 drop into both eyes daily.    glucose blood test strip 1 each by Other route as needed for other. Use as instructed   Lancets 30G MISC by Does not apply route. ONE TOUCH ULTRA 250.00   lisinopril-hydrochlorothiazide (PRINZIDE,ZESTORETIC) 10-12.5 MG per tablet Take 1 tablet by mouth daily.   metFORMIN (GLUCOPHAGE) 1000 MG tablet Take 1,000 mg by mouth 2 (two) times daily.   nitroGLYCERIN (NITROSTAT) 0.4 MG SL tablet Place 1 tablet (0.4 mg total) under the tongue every 5 (five) minutes as needed for chest pain.     Allergies:   Dristan cold [chlorphen-pe-acetaminophen] and Kiwi extract   Social History   Socioeconomic History   Marital status: Divorced    Spouse name: Not on file   Number of children: Not on file   Years of education: Not on file   Highest education level: Not on file  Occupational History   Not on file  Tobacco Use   Smoking status: Former  Smokeless tobacco: Never  Vaping Use   Vaping Use: Never used  Substance and Sexual Activity   Alcohol use: Yes    Alcohol/week: 0.0 standard drinks    Comment: social   Drug use: No   Sexual activity: Not on file  Other Topics Concern   Not on file  Social History Narrative   Not on file   Social Determinants of Health   Financial Resource Strain: Not on file  Food Insecurity: Not on file  Transportation Needs: Not on file  Physical Activity: Not on file  Stress: Not on file  Social Connections: Not on file     Family History: The patient's family history includes Alcohol abuse in an other family member; Arthritis  in an other family member; Breast cancer in an other family member; Breast cancer (age of onset: 34) in her mother; Coronary artery disease in her brother; Diabetes in some other family members; Hypertension in her mother and another family member; Stroke in her father.  ROS:   Please see the history of present illness.   Losing weight, but stable compared to 6 months ago Some sciatica in the right hip.  She has been taking PT because of back and neck stiffness.  She has had some acupuncture.  All other systems reviewed and are negative.  EKGs/Labs/Other Studies Reviewed:    The following studies were reviewed today: No new data  EKG:  EKG normal sinus rhythm, poor R wave progression, left axis deviation, and no change when compared to the prior tracing performed in February 2021.  Recent Labs: 11/11/2020: ALT 20; BUN 20; Creatinine, Ser 0.91; Hemoglobin 12.8; Platelets 246; Potassium 4.0; Sodium 140; TSH 0.892  Recent Lipid Panel No results found for: CHOL, TRIG, HDL, CHOLHDL, VLDL, LDLCALC, LDLDIRECT  Physical Exam:    VS:  BP 128/70   Pulse 75   Ht 5\' 8"  (1.727 m)   Wt 133 lb (60.3 kg)   SpO2 97%   BMI 20.22 kg/m     Wt Readings from Last 3 Encounters:  12/18/20 133 lb (60.3 kg)  11/11/20 131 lb 14.4 oz (59.8 kg)  05/14/20 136 lb 3.2 oz (61.8 kg)     GEN: Losing weight.. No acute distress HEENT: Normal NECK: No JVD. LYMPHATICS: No lymphadenopathy CARDIAC: No murmur. RRR no gallop, or edema. VASCULAR:  Normal Pulses. No bruits. RESPIRATORY:  Clear to auscultation without rales, wheezing or rhonchi  ABDOMEN: Soft, non-tender, non-distended, No pulsatile mass, MUSCULOSKELETAL: No deformity  SKIN: Warm and dry NEUROLOGIC:  Alert and oriented x 3 PSYCHIATRIC:  Normal affect   ASSESSMENT:    1. Atherosclerosis of native coronary artery of native heart with angina pectoris ([place])   2. Essential hypertension   3. Other hyperlipidemia   4. Diabetes mellitus without  complication ([place])   5. Cerebrovascular disease    PLAN:    In order of problems listed above:  Secondary prevention reviewed.  Encouraged increasing physical activity to 150 minutes/week.  Notify of angina. Current blood pressure control is excellent.  Continue Zestoretic at current dose. LDL is near 70 in January.  If still above 70 on the next determination, would recommend increasing atorvastatin to 80 mg/day. Hemoglobin A1c is less than 7.  Continue same therapy. No recurrent neurological symptoms.  Overall education and awareness concerning primary/secondary risk prevention was discussed in detail: LDL less than 70, hemoglobin A1c less than 7, blood pressure target less than 130/80 mmHg, >150 minutes of moderate aerobic activity per week,  avoidance of smoking, weight control (via diet and exercise), and continued surveillance/management of/for obstructive sleep apnea.    Medication Adjustments/Labs and Tests Ordered: Current medicines are reviewed at length with the patient today.  Concerns regarding medicines are outlined above.  Orders Placed This Encounter  Procedures   EKG 12-Lead   No orders of the defined types were placed in this encounter.      Signed, Sinclair Grooms, MD  12/18/2020 10:46 AM    [place]

## 2020-12-18 ENCOUNTER — Encounter: Payer: Self-pay | Admitting: Interventional Cardiology

## 2020-12-18 ENCOUNTER — Ambulatory Visit: Payer: Medicare PPO | Admitting: Interventional Cardiology

## 2020-12-18 ENCOUNTER — Other Ambulatory Visit: Payer: Self-pay

## 2020-12-18 VITALS — BP 128/70 | HR 75 | Ht 68.0 in | Wt 133.0 lb

## 2020-12-18 DIAGNOSIS — E119 Type 2 diabetes mellitus without complications: Secondary | ICD-10-CM | POA: Diagnosis not present

## 2020-12-18 DIAGNOSIS — I10 Essential (primary) hypertension: Secondary | ICD-10-CM

## 2020-12-18 DIAGNOSIS — E7849 Other hyperlipidemia: Secondary | ICD-10-CM

## 2020-12-18 DIAGNOSIS — I679 Cerebrovascular disease, unspecified: Secondary | ICD-10-CM | POA: Diagnosis not present

## 2020-12-18 DIAGNOSIS — I25119 Atherosclerotic heart disease of native coronary artery with unspecified angina pectoris: Secondary | ICD-10-CM

## 2020-12-18 NOTE — Patient Instructions (Signed)

## 2021-01-01 DIAGNOSIS — Z20822 Contact with and (suspected) exposure to covid-19: Secondary | ICD-10-CM | POA: Diagnosis not present

## 2021-01-02 DIAGNOSIS — Z20822 Contact with and (suspected) exposure to covid-19: Secondary | ICD-10-CM | POA: Diagnosis not present

## 2021-02-12 DIAGNOSIS — L650 Telogen effluvium: Secondary | ICD-10-CM | POA: Diagnosis not present

## 2021-02-12 DIAGNOSIS — L219 Seborrheic dermatitis, unspecified: Secondary | ICD-10-CM | POA: Diagnosis not present

## 2021-02-12 DIAGNOSIS — E569 Vitamin deficiency, unspecified: Secondary | ICD-10-CM | POA: Diagnosis not present

## 2021-04-15 DIAGNOSIS — Z853 Personal history of malignant neoplasm of breast: Secondary | ICD-10-CM | POA: Diagnosis not present

## 2021-04-15 DIAGNOSIS — Z9189 Other specified personal risk factors, not elsewhere classified: Secondary | ICD-10-CM | POA: Diagnosis not present

## 2021-04-15 DIAGNOSIS — N898 Other specified noninflammatory disorders of vagina: Secondary | ICD-10-CM | POA: Diagnosis not present

## 2021-04-17 ENCOUNTER — Ambulatory Visit
Admission: RE | Admit: 2021-04-17 | Discharge: 2021-04-17 | Disposition: A | Discharge status: Home / Self Care | Payer: Medicare PPO | Source: Ambulatory Visit | Attending: Oncology | Admitting: Oncology

## 2021-04-17 DIAGNOSIS — R922 Inconclusive mammogram: Secondary | ICD-10-CM | POA: Diagnosis not present

## 2021-04-17 DIAGNOSIS — Z170 Estrogen receptor positive status [ER+]: Secondary | ICD-10-CM

## 2021-05-20 ENCOUNTER — Inpatient Hospital Stay: Payer: Medicare PPO | Attending: Hematology and Oncology

## 2021-05-20 ENCOUNTER — Inpatient Hospital Stay: Payer: Medicare PPO | Admitting: Hematology and Oncology

## 2021-05-20 ENCOUNTER — Other Ambulatory Visit: Payer: Self-pay

## 2021-05-20 ENCOUNTER — Encounter: Payer: Self-pay | Admitting: Hematology and Oncology

## 2021-05-20 VITALS — BP 118/67 | HR 88 | Temp 97.7°F | Resp 16 | Ht 68.0 in | Wt 132.8 lb

## 2021-05-20 DIAGNOSIS — Z170 Estrogen receptor positive status [ER+]: Secondary | ICD-10-CM | POA: Diagnosis not present

## 2021-05-20 DIAGNOSIS — I10 Essential (primary) hypertension: Secondary | ICD-10-CM

## 2021-05-20 DIAGNOSIS — Z79811 Long term (current) use of aromatase inhibitors: Secondary | ICD-10-CM | POA: Insufficient documentation

## 2021-05-20 DIAGNOSIS — I25119 Atherosclerotic heart disease of native coronary artery with unspecified angina pectoris: Secondary | ICD-10-CM

## 2021-05-20 DIAGNOSIS — C50411 Malignant neoplasm of upper-outer quadrant of right female breast: Secondary | ICD-10-CM

## 2021-05-20 DIAGNOSIS — Z79899 Other long term (current) drug therapy: Secondary | ICD-10-CM | POA: Diagnosis not present

## 2021-05-20 DIAGNOSIS — E119 Type 2 diabetes mellitus without complications: Secondary | ICD-10-CM

## 2021-05-20 DIAGNOSIS — E782 Mixed hyperlipidemia: Secondary | ICD-10-CM

## 2021-05-20 DIAGNOSIS — I6502 Occlusion and stenosis of left vertebral artery: Secondary | ICD-10-CM

## 2021-05-20 LAB — CBC WITH DIFFERENTIAL/PLATELET
Abs Immature Granulocytes: 0.03 10*3/uL (ref 0.00–0.07)
Basophils Absolute: 0 10*3/uL (ref 0.0–0.1)
Basophils Relative: 0 %
Eosinophils Absolute: 0.2 10*3/uL (ref 0.0–0.5)
Eosinophils Relative: 4 %
HCT: 40.9 % (ref 36.0–46.0)
Hemoglobin: 13.2 g/dL (ref 12.0–15.0)
Immature Granulocytes: 1 %
Lymphocytes Relative: 36 %
Lymphs Abs: 2 10*3/uL (ref 0.7–4.0)
MCH: 28.8 pg (ref 26.0–34.0)
MCHC: 32.3 g/dL (ref 30.0–36.0)
MCV: 89.1 fL (ref 80.0–100.0)
Monocytes Absolute: 0.4 10*3/uL (ref 0.1–1.0)
Monocytes Relative: 7 %
Neutro Abs: 2.9 10*3/uL (ref 1.7–7.7)
Neutrophils Relative %: 52 %
Platelets: 219 10*3/uL (ref 150–400)
RBC: 4.59 MIL/uL (ref 3.87–5.11)
RDW: 14.7 % (ref 11.5–15.5)
WBC: 5.5 10*3/uL (ref 4.0–10.5)
nRBC: 0 % (ref 0.0–0.2)

## 2021-05-20 LAB — COMPREHENSIVE METABOLIC PANEL
ALT: 21 U/L (ref 0–44)
AST: 18 U/L (ref 15–41)
Albumin: 4.5 g/dL (ref 3.5–5.0)
Alkaline Phosphatase: 70 U/L (ref 38–126)
Anion gap: 8 (ref 5–15)
BUN: 16 mg/dL (ref 8–23)
CO2: 30 mmol/L (ref 22–32)
Calcium: 9.9 mg/dL (ref 8.9–10.3)
Chloride: 105 mmol/L (ref 98–111)
Creatinine, Ser: 0.82 mg/dL (ref 0.44–1.00)
GFR, Estimated: >60 mL/min (ref >60)
Glucose, Bld: 107 mg/dL — ABNORMAL HIGH (ref 70–99)
Potassium: 3.6 mmol/L (ref 3.5–5.1)
Sodium: 143 mmol/L (ref 135–145)
Total Bilirubin: 0.3 mg/dL (ref 0.3–1.2)
Total Protein: 7.4 g/dL (ref 6.5–8.1)

## 2021-05-20 NOTE — Progress Notes (Signed)
[facility]  Telephone:(336) [phone] Fax:(336) [phone]     ID: Mackenzie Gibbs DOB: 1940/04/26  MR#: [id number]  HBZ#:[id number]  Patient Care Team: Lavone Orn, MD as PCP - General (Internal Medicine) Belva Crome, MD as PCP - Cardiology (Cardiology) Rockwell Germany, RN as Oncology Nurse Navigator Mauro Kaufmann, RN as Oncology Nurse Navigator Rolm Bookbinder, MD as Consulting Physician (General Surgery) Magrinat, Virgie Dad, MD (Inactive) as Consulting Physician (Oncology) Kyung Rudd, MD as Consulting Physician (Radiation Oncology) Belva Crome, MD as Consulting Physician (Cardiology) Jola Schmidt, MD as Consulting Physician (Ophthalmology) Benay Pike, MD OTHER MD:  CHIEF COMPLAINT: estrogen receptor positive breast cancer  CURRENT TREATMENT: Anastrozole   INTERVAL HISTORY:  Mackenzie Gibbs returns today for follow up of her estrogen receptor positive breast cancer.  She had some hair loss with anastrozole and considered switching to tamoxifen April 2022.  However she was concerned about blood clot risk with tamoxifen hence decided to stay back on anastrozole.  She is now following up with a dermatologist who has recommended some oil for her headache thrice a week.  She has also been taking some hair and nail supplements. She is tolerating anastrozole well except for the hair thinning.  She did not notice any changes in her breast.  Her most recent mammogram in January without any evidence of malignancy.  She will be due for repeat mammography January 2024.  Bone density screening on 05/22/2020 shows a T score of -1.0 (normal).[name]   REVIEW OF SYSTEMS:  Rest of the pertinent 10 point ROS reviewed and negative.  COVID 19 VACCINATION STATUS: fully vaccinated [name]) with booster x2 as of August 2022  HISTORY OF CURRENT ILLNESS: From the original intake note:  Mackenzie Gibbs had routine screening mammography on 04/13/2019 showing a possible  abnormality in the right breast. She underwent right diagnostic mammography with tomography and right breast ultrasonography at [facility] on 04/19/2019 showing: breast density category B; suspicious 0.6 cm right breast mass at 10 o'clock; no suspicious-appearing lymph nodes.  Accordingly on 04/20/2019 she proceeded to biopsy of the right breast area in question. The pathology from this procedure ([id number]) showed: invasive mammary carcinoma with lobular features, grade 2, e-cadherin weakly positive.  (This was presented as lobular on conference 05/02/2019).  Prognostic indicators significant for: estrogen receptor, 90% positive and progesterone receptor, 95% positive, both with strong staining intensity. Proliferation marker Ki67 at 25%. HER2 negative by immunohistochemistry (1+).  The patient's subsequent history is as detailed below.   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 ([place]) 2004   WITH RCA AND CIRCUMFLEX TAXUS DES   Cerebrovascular disease    Diabetes mellitus without complication (HCC)    Dry eyes    Hyperlipidemia    Hypertension    Reflux    Stenosis of left vertebral artery    75%    PAST SURGICAL HISTORY: Past Surgical History:  Procedure Laterality Date   ABDOMINAL HYSTERECTOMY  1988   partial, per patient   BREAST LUMPECTOMY WITH RADIOACTIVE SEED AND SENTINEL LYMPH NODE BIOPSY Right 05/24/2019   Procedure: RADIOACTIVE SEED GUIDED RIGHT BREAST LUMPECTOMY, RIGHT AXIALLARY SENTINEL LYMPH NODE BIOPSY;  Surgeon: Rolm Bookbinder, MD;  Location: [facility];  Service: General;  Laterality: Right;   cornary artery stents     2   TONSILLECTOMY      FAMILY HISTORY: Family History  Problem Relation Age of  Onset   Stroke Father    Hypertension Mother    Breast cancer Mother 65   Diabetes Other    Hypertension Other    Arthritis Other    Breast cancer Other     Diabetes Other    Coronary artery disease Brother    Alcohol abuse Other   Patient's father was 65 years old when he died from cerebral hemorrhage. Patient's mother died from breast cancer at age 65. She was diagnosed at age 91. The patient denies a family hx of ovarian cancer. She does note a maternal aunt with "stomach" cancer. She has 1 brother.   GYNECOLOGIC HISTORY:  No LMP recorded. Patient is postmenopausal. Menarche: 81 years old Age at first live birth: 81 years old [place] P 3 LMP age 37 due to hysterectomy Contraceptive: never used HRT never used  Hysterectomy? Yes, for uterine fibroids BSO? 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 She is divorced. She lives at home by herself, with no pets. [name], age 98, is an Sales executive in [place], [place]. Daughter Mackenzie Ou M.D., age 52, is a podiatrist in [place], [place]. [name], age 25, is an event planner in [place]. Mackenzie Gibbs is status post bilateral mastectomies for breast cancer.  The patient has two grandchildren and one great-grandchild. She attends [employer] DIRECTIVES: Not addressed   HEALTH MAINTENANCE: Social History   Tobacco Use   Smoking status: Former   Smokeless tobacco: Never  Vaping Use   Vaping Use: Never used  Substance Use Topics   Alcohol use: Yes    Alcohol/week: 0.0 standard drinks    Comment: social   Drug use: No     Colonoscopy: 2017  PAP: none on file  Bone density: date unknown   Allergies  Allergen Reactions   Dristan Cold [Chlorphen-Pe-Acetaminophen]     EYES PUFFY   Kiwi Extract     DEATHLY ILL    Current Outpatient Medications  Medication Sig Dispense Refill   anastrozole (ARIMIDEX) 1 MG tablet TAKE 1 TABLET(1 MG) BY MOUTH DAILY 90 tablet 4   aspirin EC 81 MG tablet  Take 81 mg by mouth daily.     atorvastatin (LIPITOR) 40 MG tablet Take 40 mg by mouth daily.     beta carotene w/minerals (OCUVITE) tablet Take 1 tablet by mouth daily.     Biotin 10000 MCG TABS Take 1,000 mg by mouth daily.     COVID-19 mRNA vaccine, Moderna, (MODERNA COVID-19 VACCINE) 100 MCG/0.5ML injection Inject into the muscle. 0.25 mL 0   cycloSPORINE (RESTASIS) 0.05 % ophthalmic emulsion Place 1 drop into both eyes daily.      glucose blood test strip 1 each by Other route as needed for other. Use as instructed     Lancets 30G MISC by Does not apply route. ONE TOUCH ULTRA 250.00     lisinopril-hydrochlorothiazide (PRINZIDE,ZESTORETIC) 10-12.5 MG per tablet Take 1 tablet by mouth daily.     metFORMIN (GLUCOPHAGE) 1000 MG tablet Take 1,000 mg by mouth 2 (two) times daily.     nitroGLYCERIN (NITROSTAT) 0.4 MG SL tablet Place 1 tablet (0.4 mg total) under the tongue every 5 (five) minutes as needed for chest pain. 25 tablet 1   No current facility-administered medications for this  visit.    OBJECTIVE:  African-American woman who appears stated age  28:   05/20/21 1334  BP: 118/67  Pulse: 88  Resp: 16  Temp: 97.7 F (36.5 C)  SpO2: 98%      Body mass index is 20.19 kg/m.   Wt Readings from Last 3 Encounters:  05/20/21 132 lb 12.8 oz (60.2 kg)  12/18/20 133 lb (60.3 kg)  11/11/20 131 lb 14.4 oz (59.8 kg)      ECOG FS:1 - Symptomatic but completely ambulatory  Sclerae unicteric, EOMs intact Wearing a mask No cervical or supraclavicular adenopathy Lungs no rales or rhonchi Heart regular rate and rhythm Abd soft, nontender, positive bowel sounds MSK no focal spinal tenderness, no upper extremity lymphedema Neuro: nonfocal, well oriented, appropriate affect Breasts: Both breasts examined.  No concern for recurrence.  No regional adenopathy in the right breast.   LAB RESULTS:  CMP     Component Value Date/Time   NA 140 11/11/2020 1322   K 4.0 11/11/2020 1322    CL 104 11/11/2020 1322   CO2 28 11/11/2020 1322   GLUCOSE 87 11/11/2020 1322   BUN 20 11/11/2020 1322   CREATININE 0.91 11/11/2020 1322   CREATININE 0.93 05/02/2019 1221   CALCIUM 9.6 11/11/2020 1322   PROT 7.4 11/11/2020 1322   ALBUMIN 4.2 11/11/2020 1322   AST 17 11/11/2020 1322   AST 14 (L) 05/02/2019 1221   ALT 20 11/11/2020 1322   ALT 17 05/02/2019 1221   ALKPHOS 69 11/11/2020 1322   BILITOT 0.4 11/11/2020 1322   BILITOT 0.3 05/02/2019 1221   GFRNONAA >60 11/11/2020 1322   GFRNONAA 59 (L) 05/02/2019 1221   GFRAA >60 07/11/2019 1340   GFRAA >60 05/02/2019 1221    No results found for: TOTALPROTELP, ALBUMINELP, A1GS, A2GS, BETS, BETA2SER, GAMS, MSPIKE, SPEI  Lab Results  Component Value Date   WBC 5.5 05/20/2021   NEUTROABS 2.9 05/20/2021   HGB 13.2 05/20/2021   HCT 40.9 05/20/2021   MCV 89.1 05/20/2021   PLT 219 05/20/2021    No results found for: LABCA2  No components found for: TMHDQQ229  No results for input(s): INR in the last 168 hours.  No results found for: LABCA2  No results found for: NLG921  No results found for: JHE174  No results found for: YCX448  No results found for: CA2729  No components found for: HGQUANT  No results found for: CEA1 / No results found for: CEA1   No results found for: AFPTUMOR  No results found for: CHROMOGRNA  No results found for: KPAFRELGTCHN, LAMBDASER, KAPLAMBRATIO (kappa/lambda light chains)  No results found for: HGBA, HGBA2QUANT, HGBFQUANT, HGBSQUAN (Hemoglobinopathy evaluation)   No results found for: LDH  No results found for: IRON, TIBC, IRONPCTSAT (Iron and TIBC)  No results found for: FERRITIN  Urinalysis No results found for: COLORURINE, APPEARANCEUR, LABSPEC, PHURINE, GLUCOSEU, HGBUR, BILIRUBINUR, KETONESUR, PROTEINUR, UROBILINOGEN, NITRITE, LEUKOCYTESUR   STUDIES: No results found.    ELIGIBLE FOR AVAILABLE RESEARCH PROTOCOL: no  ASSESSMENT: 81 y.o. [place] woman status post  right breast upper outer quadrant biopsy 04/20/2019 for a clinical T1b N0, stage IA invasive lobular carcinoma (E-cadherin weakly positive), grade 2, estrogen and progesterone receptor positive, HER-2 not amplified, with an MIB-1 of 25%  (1) status post right lumpectomy and sentinel lymph node sampling 05/24/2019 for a pT1C pN0, stage 1A invasive lobular breast cancer, grade 2, with negative margins  (a) a total of 3 axillary lymph nodes were removed  (2)  Oncotype score of 16 predicts a risk of recurrence outside the breast in the next 9 years of 4% if the patient's only systemic therapy is antiestrogens for 5 years  (3) patient opted against adjuvant radiation  (4) anastrozole started 07/11/2019  (a) bone density 05/22/2020 at [facility] showed a T score of -1.0    PLAN:  Mackenzie Gibbs is now a year and a half out from definitive surgery for her breast cancer with no evidence of disease recurrence.   She is tolerating anastrozole well except for some hair thinning.  She is okay to continue anastrozole as prescribed until April 2026. She will continue to follow-up with dermatology for her hair loss issues.  Last mammogram in January 2023, negative for malignancy, repeat due in January 2024 Bone density due in February 2024, last bone density was normal.  Encouraged vitamin D supplementation and weightbearing exercises. Return to clinic with [place] in 6 months.  Total encounter time 30 minutes.Benay Pike, MD   05/20/2021 1:52 PM Medical Oncology and Hematology [facility] [place], [place] [address] Tel. [phone]    Fax. [phone]   *Total Encounter Time as defined by the Centers for Medicare and Medicaid Services includes, in addition to the face-to-face time of a patient visit (documented in the note above) non-face-to-face time: obtaining and reviewing outside history, ordering and reviewing medications, tests or procedures, care coordination  (communications with other health care professionals or caregivers) and documentation in the medical record.

## 2021-05-20 NOTE — Progress Notes (Signed)
Mackenzie Gibbs  Telephone:(336) [phone] Fax:(336) [phone]     ID: Mackenzie Gibbs DOB: 05-Oct-1940  MR#: [id number]  PPI#:[id number]  Patient Care Team: Lavone Orn, MD as PCP - General (Internal Medicine) Belva Crome, MD as PCP - Cardiology (Cardiology) Rockwell Germany, RN as Oncology Nurse Navigator Mauro Kaufmann, RN as Oncology Nurse Navigator Rolm Bookbinder, MD as Consulting Physician (General Surgery) Magrinat, Virgie Dad, MD (Inactive) as Consulting Physician (Oncology) Kyung Rudd, MD as Consulting Physician (Radiation Oncology) Belva Crome, MD as Consulting Physician (Cardiology) Jola Schmidt, MD as Consulting Physician (Ophthalmology) Benay Pike, MD OTHER MD:  CHIEF COMPLAINT: estrogen receptor positive breast cancer  CURRENT TREATMENT: Anastrozole   INTERVAL HISTORY: Mackenzie Gibbs returns today for follow up of her estrogen receptor positive breast cancer.   She had some hair loss with anastrozole and considered switching to tamoxifen April 2022.Mackenzie Gibbs  However after she got more information and learned about the possible blood clot she decided anastrozole was the better bit.  She will be due for repeat mammography January 2023.  Bone density screening on 05/22/2020 shows a T score of -1.0 (normal).Mackenzie Gibbs   REVIEW OF SYSTEMS: Bonnie i has been having family stay with her while they purchased a new home.  She has been enjoying her great granddaughter who is 17 months but caring her around is causing her some back pain.  Aside from that a detailed review of systems was noncontributory.   COVID 19 VACCINATION STATUS: fully vaccinated [name]) with booster x2 as of August 2022  HISTORY OF CURRENT ILLNESS: From the original intake note:  Mackenzie Gibbs had routine screening mammography on 04/13/2019 showing a possible abnormality in the right breast. She underwent right diagnostic mammography with tomography and right breast ultrasonography at [facility] on 04/19/2019 showing: breast density category B; suspicious 0.6 cm right breast mass at 10 o'clock; no suspicious-appearing lymph nodes.  Accordingly on 04/20/2019 she proceeded to biopsy of the right breast area in question. The pathology from this procedure ([id number]) showed: invasive mammary carcinoma with lobular features, grade 2, e-cadherin weakly positive.  (This was presented as lobular on conference 05/02/2019).  Prognostic indicators significant for: estrogen receptor, 90% positive and progesterone receptor, 95% positive, both with strong staining intensity. Proliferation marker Ki67 at 25%. HER2 negative by immunohistochemistry (1+).  The patient's subsequent history is as detailed below.   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 ([place]) 2004   WITH RCA AND CIRCUMFLEX TAXUS DES   Cerebrovascular disease    Diabetes mellitus without complication (HCC)    Dry eyes    Hyperlipidemia    Hypertension    Reflux    Stenosis of left vertebral artery    75%    PAST SURGICAL HISTORY: Past Surgical History:  Procedure Laterality Date   ABDOMINAL HYSTERECTOMY  1988   partial, per patient   BREAST LUMPECTOMY WITH RADIOACTIVE SEED AND SENTINEL LYMPH NODE BIOPSY Right 05/24/2019   Procedure: RADIOACTIVE SEED GUIDED RIGHT BREAST LUMPECTOMY, RIGHT AXIALLARY SENTINEL LYMPH NODE BIOPSY;  Surgeon: Rolm Bookbinder, MD;  Location: [facility];  Service: General;  Laterality: Right;   cornary artery stents     2   TONSILLECTOMY      FAMILY HISTORY: Family History  Problem Relation Age of Onset   Stroke Father    Hypertension Mother    Breast cancer Mother 51   Diabetes Other  Hypertension Other    Arthritis Other    Breast cancer Other    Diabetes Other    Coronary artery disease Brother    Alcohol abuse Other   Patient's father was 60 years old when he died  from cerebral hemorrhage. Patient's mother died from breast cancer at age 17. She was diagnosed at age 18. The patient denies a family hx of ovarian cancer. She does note a maternal aunt with "stomach" cancer. She has 1 brother.   GYNECOLOGIC HISTORY:  No LMP recorded. Patient is postmenopausal. Menarche: 81 years old Age at first live birth: 81 years old [place] P 3 LMP age 53 due to hysterectomy Contraceptive: never used HRT never used  Hysterectomy? Yes, for uterine fibroids BSO? 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 She is divorced. She lives at home by herself, with no pets. Son Everardo Beals III, age 21, is an Sales executive in [place], [place]. Daughter Mackenzie Ou M.D., age 70, is a podiatrist in [place], [place]. [name], age 93, is an event planner in [place]. Mackenzie Gibbs is status post bilateral mastectomies for breast cancer.  The patient has two grandchildren and one great-grandchild. She attends [employer] DIRECTIVES: Not addressed   HEALTH MAINTENANCE: Social History   Tobacco Use   Smoking status: Former   Smokeless tobacco: Never  Vaping Use   Vaping Use: Never used  Substance Use Topics   Alcohol use: Yes    Alcohol/week: 0.0 standard drinks    Comment: social   Drug use: No     Colonoscopy: 2017  PAP: none on file  Bone density: date unknown   Allergies  Allergen Reactions   Dristan Cold [Chlorphen-Pe-Acetaminophen]     EYES PUFFY   Kiwi Extract     DEATHLY ILL    Current Outpatient Medications  Medication Sig Dispense Refill   anastrozole (ARIMIDEX) 1 MG tablet TAKE 1 TABLET(1 MG) BY MOUTH DAILY 90 tablet 4   aspirin EC 81 MG tablet Take 81 mg by mouth daily.     atorvastatin (LIPITOR) 40 MG tablet Take 40 mg by mouth daily.     beta carotene w/minerals  (OCUVITE) tablet Take 1 tablet by mouth daily.     Biotin 10000 MCG TABS Take 1,000 mg by mouth daily.     COVID-19 mRNA vaccine, Moderna, (MODERNA COVID-19 VACCINE) 100 MCG/0.5ML injection Inject into the muscle. 0.25 mL 0   cycloSPORINE (RESTASIS) 0.05 % ophthalmic emulsion Place 1 drop into both eyes daily.      glucose blood test strip 1 each by Other route as needed for other. Use as instructed     Lancets 30G MISC by Does not apply route. ONE TOUCH ULTRA 250.00     lisinopril-hydrochlorothiazide (PRINZIDE,ZESTORETIC) 10-12.5 MG per tablet Take 1 tablet by mouth daily.     metFORMIN (GLUCOPHAGE) 1000 MG tablet Take 1,000 mg by mouth 2 (two) times daily.     nitroGLYCERIN (NITROSTAT) 0.4 MG SL tablet Place 1 tablet (0.4 mg total) under the tongue every 5 (five) minutes as needed for chest pain. 25 tablet 1   No current facility-administered medications for this visit.    OBJECTIVE:  African-American woman who appears stated age  81:   05/20/21 1334  BP: 118/67  Pulse: 88  Resp: 16  Temp: 97.7 F (36.5 C)  SpO2: 98%      Body mass index is 20.19 kg/m.   Wt Readings from Last 3 Encounters:  05/20/21 132 lb 12.8 oz (60.2 kg)  12/18/20 133 lb (60.3 kg)  11/11/20 131 lb 14.4 oz (59.8 kg)      ECOG FS:1 - Symptomatic but completely ambulatory  Sclerae unicteric, EOMs intact Wearing a mask No cervical or supraclavicular adenopathy Lungs no rales or rhonchi Heart regular rate and rhythm Abd soft, nontender, positive bowel sounds MSK no focal spinal tenderness, no upper extremity lymphedema Neuro: nonfocal, well oriented, appropriate affect Breasts: The right breast is status post lumpectomy.  There is with no evidence of residual or recurrent disease.  The left breast is benign.  Both axillae are benign.   LAB RESULTS:  CMP     Component Value Date/Time   NA 143 05/20/2021 1325   K 3.6 05/20/2021 1325   CL 105 05/20/2021 1325   CO2 30 05/20/2021 1325   GLUCOSE  107 (H) 05/20/2021 1325   BUN 16 05/20/2021 1325   CREATININE 0.82 05/20/2021 1325   CREATININE 0.93 05/02/2019 1221   CALCIUM 9.9 05/20/2021 1325   PROT 7.4 05/20/2021 1325   ALBUMIN 4.5 05/20/2021 1325   AST 18 05/20/2021 1325   AST 14 (L) 05/02/2019 1221   ALT 21 05/20/2021 1325   ALT 17 05/02/2019 1221   ALKPHOS 70 05/20/2021 1325   BILITOT 0.3 05/20/2021 1325   BILITOT 0.3 05/02/2019 1221   GFRNONAA >60 05/20/2021 1325   GFRNONAA 59 (L) 05/02/2019 1221   GFRAA >60 07/11/2019 1340   GFRAA >60 05/02/2019 1221    No results found for: TOTALPROTELP, ALBUMINELP, A1GS, A2GS, BETS, BETA2SER, GAMS, MSPIKE, SPEI  Lab Results  Component Value Date   WBC 5.5 05/20/2021   NEUTROABS 2.9 05/20/2021   HGB 13.2 05/20/2021   HCT 40.9 05/20/2021   MCV 89.1 05/20/2021   PLT 219 05/20/2021    No results found for: LABCA2  No components found for: ZOXWRU045  No results for input(s): INR in the last 168 hours.  No results found for: LABCA2  No results found for: WUJ811  No results found for: BJY782  No results found for: NFA213  No results found for: CA2729  No components found for: HGQUANT  No results found for: CEA1 / No results found for: CEA1   No results found for: AFPTUMOR  No results found for: CHROMOGRNA  No results found for: KPAFRELGTCHN, LAMBDASER, KAPLAMBRATIO (kappa/lambda light chains)  No results found for: HGBA, HGBA2QUANT, HGBFQUANT, HGBSQUAN (Hemoglobinopathy evaluation)   No results found for: LDH  No results found for: IRON, TIBC, IRONPCTSAT (Iron and TIBC)  No results found for: FERRITIN  Urinalysis No results found for: COLORURINE, APPEARANCEUR, LABSPEC, PHURINE, GLUCOSEU, HGBUR, BILIRUBINUR, KETONESUR, PROTEINUR, UROBILINOGEN, NITRITE, LEUKOCYTESUR   STUDIES: No results found.    ELIGIBLE FOR AVAILABLE RESEARCH PROTOCOL: no  ASSESSMENT: 81 y.o. [place] woman status post right breast upper outer quadrant biopsy 04/20/2019 for a  clinical T1b N0, stage IA invasive lobular carcinoma (E-cadherin weakly positive), grade 2, estrogen and progesterone receptor positive, HER-2 not amplified, with an MIB-1 of 25%  (1) status post right lumpectomy and sentinel lymph node sampling 05/24/2019 for a pT1C pN0, stage 1A invasive lobular breast cancer, grade 2, with negative margins  (a) a total of 3 axillary lymph nodes were removed  (2) Oncotype score of 16 predicts a risk of recurrence outside the  breast in the next 9 years of 4% if the patient's only systemic therapy is antiestrogens for 5 years  (3) patient opted against adjuvant radiation  (4) anastrozole started 07/11/2019  (a) bone density 05/22/2020 at the [facility] showed a T score of -1.0     PLAN:  Shelbi is now a year and a half out from definitive surgery for her breast cancer with no evidence of disease recurrence.  This is very favorable.  She considered switching to tamoxifen but the idea of possible blood clots determined to her so she is willing to continue anastrozole to a total of 5 years.  She is taking some biotin which she feels is helping prevent further hair loss.  Today she mentioned that her youngest daughter had breast cancer and underwent bilateral mastectomies.  I do not think we knew that.  The patient's mother did have breast cancer so I think it now qualifies for genetics testing.  I have sent a note to our geneticist to check on this.  I encouraged her to remain as active as possible.  It is wonderful that she continues to play at church almost on a weekly basis as well as on other occasions.  She will have mammography again in January and will return to see [place] in February.  That will be her 2-year mark and from that point we will start seeing her on a once a year basis.  Total encounter time 25 minutes.Benay Pike, MD   05/20/2021 1:54 PM Medical Oncology and Hematology [facility] [place],   [address] Tel. [phone]    Fax. [phone]   This document serves as a record of services personally performed by Lurline Del, MD. It was created on his behalf by Wilburn Mylar, a trained medical scribe. The creation of this record is based on the scribe's personal observations and the provider's statements to them.   I, Lurline Del MD, have reviewed the above documentation for accuracy and completeness, and I agree with the above.   *Total Encounter Time as defined by the Centers for Medicare and Medicaid Services includes, in addition to the face-to-face time of a patient visit (documented in the note above) non-face-to-face time: obtaining and reviewing outside history, ordering and reviewing medications, tests or procedures, care coordination (communications with other health care professionals or caregivers) and documentation in the medical record.

## 2021-05-22 DIAGNOSIS — E7800 Pure hypercholesterolemia, unspecified: Secondary | ICD-10-CM | POA: Diagnosis not present

## 2021-05-22 DIAGNOSIS — I2510 Atherosclerotic heart disease of native coronary artery without angina pectoris: Secondary | ICD-10-CM | POA: Diagnosis not present

## 2021-05-22 DIAGNOSIS — Z1389 Encounter for screening for other disorder: Secondary | ICD-10-CM | POA: Diagnosis not present

## 2021-05-22 DIAGNOSIS — Z853 Personal history of malignant neoplasm of breast: Secondary | ICD-10-CM | POA: Diagnosis not present

## 2021-05-22 DIAGNOSIS — Z23 Encounter for immunization: Secondary | ICD-10-CM | POA: Diagnosis not present

## 2021-05-22 DIAGNOSIS — E1151 Type 2 diabetes mellitus with diabetic peripheral angiopathy without gangrene: Secondary | ICD-10-CM | POA: Diagnosis not present

## 2021-05-22 DIAGNOSIS — Z0000 Encounter for general adult medical examination without abnormal findings: Secondary | ICD-10-CM | POA: Diagnosis not present

## 2021-05-22 DIAGNOSIS — I10 Essential (primary) hypertension: Secondary | ICD-10-CM | POA: Diagnosis not present

## 2021-07-30 DIAGNOSIS — L219 Seborrheic dermatitis, unspecified: Secondary | ICD-10-CM | POA: Diagnosis not present

## 2021-07-30 DIAGNOSIS — L650 Telogen effluvium: Secondary | ICD-10-CM | POA: Diagnosis not present

## 2021-07-30 DIAGNOSIS — L658 Other specified nonscarring hair loss: Secondary | ICD-10-CM | POA: Diagnosis not present

## 2021-09-23 DIAGNOSIS — I739 Peripheral vascular disease, unspecified: Secondary | ICD-10-CM | POA: Diagnosis not present

## 2021-09-23 DIAGNOSIS — I10 Essential (primary) hypertension: Secondary | ICD-10-CM | POA: Diagnosis not present

## 2021-09-23 DIAGNOSIS — E1151 Type 2 diabetes mellitus with diabetic peripheral angiopathy without gangrene: Secondary | ICD-10-CM | POA: Diagnosis not present

## 2021-10-27 DIAGNOSIS — H5203 Hypermetropia, bilateral: Secondary | ICD-10-CM | POA: Diagnosis not present

## 2021-10-27 DIAGNOSIS — H25013 Cortical age-related cataract, bilateral: Secondary | ICD-10-CM | POA: Diagnosis not present

## 2021-11-17 ENCOUNTER — Inpatient Hospital Stay: Payer: Medicare PPO | Attending: Hematology and Oncology | Admitting: Hematology and Oncology

## 2021-12-21 ENCOUNTER — Other Ambulatory Visit: Payer: Self-pay | Admitting: *Deleted

## 2021-12-21 DIAGNOSIS — E1151 Type 2 diabetes mellitus with diabetic peripheral angiopathy without gangrene: Secondary | ICD-10-CM | POA: Diagnosis not present

## 2021-12-21 DIAGNOSIS — I10 Essential (primary) hypertension: Secondary | ICD-10-CM | POA: Diagnosis not present

## 2021-12-22 ENCOUNTER — Other Ambulatory Visit: Payer: Self-pay | Admitting: *Deleted

## 2021-12-22 MED ORDER — ANASTROZOLE 1 MG PO TABS: ORAL_TABLET | ORAL | 4 refills | Status: DC

## 2022-01-05 ENCOUNTER — Inpatient Hospital Stay: Payer: Medicare PPO | Attending: Hematology and Oncology | Admitting: Hematology and Oncology

## 2022-01-05 ENCOUNTER — Encounter: Payer: Self-pay | Admitting: Hematology and Oncology

## 2022-01-05 VITALS — BP 144/81 | HR 74 | Temp 97.8°F | Resp 16 | Ht 68.0 in | Wt 135.9 lb

## 2022-01-05 DIAGNOSIS — Z170 Estrogen receptor positive status [ER+]: Secondary | ICD-10-CM | POA: Diagnosis not present

## 2022-01-05 DIAGNOSIS — Z79811 Long term (current) use of aromatase inhibitors: Secondary | ICD-10-CM | POA: Diagnosis not present

## 2022-01-05 DIAGNOSIS — C50411 Malignant neoplasm of upper-outer quadrant of right female breast: Secondary | ICD-10-CM | POA: Insufficient documentation

## 2022-01-05 NOTE — Progress Notes (Signed)
[place]  Telephone:([phone]) [phone] Fax:(336) [phone]     ID: Mackenzie Gibbs DOB: 01/27/1941  MR#: [id number]  BSJ#:[id number]  Patient Care Team: Mackenzie Orn, MD as PCP - General (Internal Medicine) Mackenzie Crome, MD as PCP - Cardiology (Cardiology) Mackenzie Germany, RN as Oncology Nurse Navigator Mackenzie Kaufmann, RN as Oncology Nurse Navigator Mackenzie Bookbinder, MD as Consulting Physician (General Surgery) Gibbs, Mackenzie Dad, MD (Inactive) as Consulting Physician (Oncology) Mackenzie Rudd, MD as Consulting Physician (Radiation Oncology) Mackenzie Crome, MD as Consulting Physician (Cardiology) Mackenzie Schmidt, MD as Consulting Physician (Ophthalmology) Mackenzie Pike, MD OTHER MD:  CHIEF COMPLAINT: estrogen receptor positive breast cancer  CURRENT TREATMENT: Anastrozole   INTERVAL HISTORY:  Mackenzie Gibbs returns today for follow up of her estrogen receptor positive breast cancer.  She had some hair loss with anastrozole and considered switching to tamoxifen April 2022.  However she was concerned about blood clot risk with tamoxifen hence decided to stay back on anastrozole.  Besides ongoing hair loss issues or hair thinning issues, she continues to tolerate anastrozole very well.  She is still reluctant to switch to tamoxifen.  She otherwise continues to stay active.  She works on [employer].  Last mammogram in January without any concerns.  Last bone density in 2022 normal. She will be due for repeat mammography January 2024. She is willing to continue 5 years of antiestrogen therapy as recommended.  Rest of the pertinent 10 point ROS reviewed and negative.   COVID 19 VACCINATION STATUS: fully vaccinated [name]) with booster x2 as of August 2022  HISTORY OF CURRENT ILLNESS: From the original intake note:  Mackenzie Gibbs had routine screening mammography on 04/13/2019 showing a possible abnormality in the right breast. She underwent right  diagnostic mammography with tomography and right breast ultrasonography at [facility] on 04/19/2019 showing: breast density category B; suspicious 0.6 cm right breast mass at 10 o'clock; no suspicious-appearing lymph nodes.  Accordingly on 04/20/2019 she proceeded to biopsy of the right breast area in question. The pathology from this procedure ([id number]) showed: invasive mammary carcinoma with lobular features, grade 2, e-cadherin weakly positive.  (This was presented as lobular on conference 05/02/2019).  Prognostic indicators significant for: estrogen receptor, 90% positive and progesterone receptor, 95% positive, both with strong staining intensity. Proliferation marker Ki67 at 25%. HER2 negative by immunohistochemistry (1+).  The patient's subsequent history is as detailed below.   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 ([place]) 2004   WITH RCA AND CIRCUMFLEX TAXUS DES   Cerebrovascular disease    Diabetes mellitus without complication (HCC)    Dry eyes    Hyperlipidemia    Hypertension    Reflux    Stenosis of left vertebral artery    75%    PAST SURGICAL HISTORY: Past Surgical History:  Procedure Laterality Date   ABDOMINAL HYSTERECTOMY  1988   partial, per patient   BREAST LUMPECTOMY WITH RADIOACTIVE SEED AND SENTINEL LYMPH NODE BIOPSY Right 05/24/2019   Procedure: RADIOACTIVE SEED GUIDED RIGHT BREAST LUMPECTOMY, RIGHT AXIALLARY SENTINEL LYMPH NODE BIOPSY;  Surgeon: Mackenzie Bookbinder, MD;  Location: [facility];  Service: General;  Laterality: Right;   cornary artery stents     2   TONSILLECTOMY      FAMILY HISTORY: Family History  Problem Relation Age of Onset   Stroke Father    Hypertension Mother    Breast  cancer Mother 61   Diabetes Other    Hypertension Other    Arthritis Other    Breast cancer Other    Diabetes Other    Coronary artery disease  Brother    Alcohol abuse Other   Patient's father was 82 years old when he died from cerebral hemorrhage. Patient's mother died from breast cancer at age 54. She was diagnosed at age 44. The patient denies a family hx of ovarian cancer. She does note a maternal aunt with "stomach" cancer. She has 1 brother.   GYNECOLOGIC HISTORY:  No LMP recorded. Patient is postmenopausal. Menarche: 81 years old Age at first live birth: 81 years old [place] P 3 LMP age 64 due to hysterectomy Contraceptive: never used HRT never used  Hysterectomy? Yes, for uterine fibroids BSO? 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 She is divorced. She lives at home by herself, with no pets. [name], age 68, is an Sales executive in [place], [place]. [name] M.D., age 59, is a podiatrist in [place], [place]. [name], age 87, is an event planner in [place]. Mackenzie Gibbs is status post bilateral mastectomies for breast cancer.  The patient has two grandchildren and one great-grandchild. She attends [employer] DIRECTIVES: Not addressed   HEALTH MAINTENANCE: Social History   Tobacco Use   Smoking status: Former   Smokeless tobacco: Never  Vaping Use   Vaping Use: Never used  Substance Use Topics   Alcohol use: Yes    Alcohol/week: 0.0 standard drinks of alcohol    Comment: social   Drug use: No     Colonoscopy: 2017  PAP: none on file  Bone density: date unknown   Allergies  Allergen Reactions   Dristan Cold [Chlorphen-Pe-Acetaminophen]     EYES PUFFY   Kiwi Extract     DEATHLY ILL    Current Outpatient Medications  Medication Sig Dispense Refill   anastrozole (ARIMIDEX) 1 MG tablet TAKE 1 TABLET(1 MG) BY MOUTH DAILY 90 tablet 4   aspirin EC 81 MG tablet Take 81 mg by mouth daily.      atorvastatin (LIPITOR) 40 MG tablet Take 40 mg by mouth daily.     beta carotene w/minerals (OCUVITE) tablet Take 1 tablet by mouth daily.     Biotin 10000 MCG TABS Take 1,000 mg by mouth daily.     COVID-19 mRNA vaccine, Moderna, (MODERNA COVID-19 VACCINE) 100 MCG/0.5ML injection Inject into the muscle. 0.25 mL 0   cycloSPORINE (RESTASIS) 0.05 % ophthalmic emulsion Place 1 drop into both eyes daily.      glucose blood test strip 1 each by Other route as needed for other. Use as instructed     Lancets 30G MISC by Does not apply route. ONE TOUCH ULTRA 250.00     lisinopril-hydrochlorothiazide (PRINZIDE,ZESTORETIC) 10-12.5 MG per tablet Take 1 tablet by mouth daily.     metFORMIN (GLUCOPHAGE) 1000 MG tablet Take 1,000 mg by mouth 2 (two) times daily.     nitroGLYCERIN (NITROSTAT) 0.4 MG SL tablet Place 1 tablet (0.4 mg total) under the tongue every 5 (five) minutes as needed for chest pain. 25 tablet 1   No current facility-administered medications for this visit.    OBJECTIVE:  African-American woman who appears stated age  Vitals:   01/05/22 1532  BP: (!) 144/81  Pulse: 74  Resp: 16  Temp: 97.8 F (36.6 C)  SpO2: 100%      Body mass index is 20.66 kg/m.   Wt Readings from Last 3 Encounters:  01/05/22 135 lb 14.4 oz (61.6 kg)  05/20/21 132 lb 12.8 oz (60.2 kg)  12/18/20 133 lb (60.3 kg)      ECOG FS:1 - Symptomatic but completely ambulatory  Physical Exam Constitutional:      Appearance: Normal appearance.  Chest:     Comments: Bilateral breasts inspected.  No palpable masses.  No regional adenopathy. Musculoskeletal:     Cervical back: Normal range of motion. No rigidity.  Lymphadenopathy:     Cervical: No cervical adenopathy.  Neurological:     Mental Status: She is alert.       LAB RESULTS:  CMP     Component Value Date/Time   NA 143 05/20/2021 1325   K 3.6 05/20/2021 1325   CL 105 05/20/2021 1325   CO2 30 05/20/2021 1325   GLUCOSE 107 (H) 05/20/2021  1325   BUN 16 05/20/2021 1325   CREATININE 0.82 05/20/2021 1325   CREATININE 0.93 05/02/2019 1221   CALCIUM 9.9 05/20/2021 1325   PROT 7.4 05/20/2021 1325   ALBUMIN 4.5 05/20/2021 1325   AST 18 05/20/2021 1325   AST 14 (L) 05/02/2019 1221   ALT 21 05/20/2021 1325   ALT 17 05/02/2019 1221   ALKPHOS 70 05/20/2021 1325   BILITOT 0.3 05/20/2021 1325   BILITOT 0.3 05/02/2019 1221   GFRNONAA >60 05/20/2021 1325   GFRNONAA 59 (L) 05/02/2019 1221   GFRAA >60 07/11/2019 1340   GFRAA >60 05/02/2019 1221    No results found for: "TOTALPROTELP", "ALBUMINELP", "A1GS", "A2GS", "BETS", "BETA2SER", "GAMS", "MSPIKE", "SPEI"  Lab Results  Component Value Date   WBC 5.5 05/20/2021   NEUTROABS 2.9 05/20/2021   HGB 13.2 05/20/2021   HCT 40.9 05/20/2021   MCV 89.1 05/20/2021   PLT 219 05/20/2021    No results found for: "LABCA2"  No components found for: "NLGXQJ194"  No results for input(s): "INR" in the last 168 hours.  No results found for: "LABCA2"  No results found for: "RDE081"  No results found for: "CAN125"  No results found for: "CAN153"  No results found for: "CA2729"  No components found for: "HGQUANT"  No results found for: "CEA1", "CEA" / No results found for: "CEA1", "CEA"   No results found for: "AFPTUMOR"  No results found for: "CHROMOGRNA"  No results found for: "KPAFRELGTCHN", "LAMBDASER", "KAPLAMBRATIO" (kappa/lambda light chains)  No results found for: "HGBA", "HGBA2QUANT", "HGBFQUANT", "HGBSQUAN" (Hemoglobinopathy evaluation)   No results found for: "LDH"  No results found for: "IRON", "TIBC", "IRONPCTSAT" (Iron and TIBC)  No results found for: "FERRITIN"  Urinalysis No results found for: "COLORURINE", "APPEARANCEUR", "LABSPEC", "PHURINE", "GLUCOSEU", "HGBUR", "BILIRUBINUR", "KETONESUR", "PROTEINUR", "UROBILINOGEN", "NITRITE", "LEUKOCYTESUR"   STUDIES: No results found.    ELIGIBLE FOR AVAILABLE RESEARCH PROTOCOL: no  ASSESSMENT: 81 y.o.  [place] woman status post right breast upper outer quadrant biopsy 04/20/2019 for a clinical T1b N0, stage IA invasive lobular carcinoma (E-cadherin weakly positive), grade 2, estrogen and progesterone receptor positive, HER-2 not amplified, with an MIB-1 of 25%  (1) status post right lumpectomy and sentinel lymph node sampling 05/24/2019 for a pT1C pN0, stage 1A invasive lobular breast cancer, grade 2, with negative margins  (a) a total of 3 axillary lymph nodes were removed  (2) Oncotype score  of 16 predicts a risk of recurrence outside the breast in the next 9 years of 4% if the patient's only systemic therapy is antiestrogens for 5 years  (3) patient opted against adjuvant radiation  (4) anastrozole started 07/11/2019  (a) bone density 05/22/2020 at the [facility] showed a T score of -1.0    PLAN:  Patient is tolerating anastrozole well except for some hair thinning.  She will continue anastrozole till April 2026.  She continues to follow-up with dermatology for his thinning, has been using some oils prescribed by them.  She is still not quite keen to switch to tamoxifen.  She is very worried about blood clots with tamoxifen.  Last mammogram in January with no concerns.  Physical examination today with no concerns.  Last bone density in 2022 normal.  I encouraged her to stay active.  Self breast exam recommended monthly.  She will return to clinic in 1 year or sooner as needed. Mammogram ordered for next year. Total encounter time 30 minutes including history, physical exam, counseling and coordination of care   Mackenzie Pike, MD   01/05/2022 3:47 PM Medical Oncology and Hematology [facility] [place], [place] [address] Tel. [phone]    Fax. [phone]   *Total Encounter Time as defined by the Centers for Medicare and Medicaid Services includes, in addition to the face-to-face time of a patient visit (documented in the note above)  non-face-to-face time: obtaining and reviewing outside history, ordering and reviewing medications, tests or procedures, care coordination (communications with other health care professionals or caregivers) and documentation in the medical record.

## 2022-01-11 ENCOUNTER — Encounter: Payer: Self-pay | Admitting: Interventional Cardiology

## 2022-01-11 ENCOUNTER — Ambulatory Visit: Payer: Medicare PPO | Attending: Interventional Cardiology | Admitting: Interventional Cardiology

## 2022-01-11 VITALS — BP 108/64 | HR 66 | Ht 68.0 in | Wt 136.6 lb

## 2022-01-11 DIAGNOSIS — E7849 Other hyperlipidemia: Secondary | ICD-10-CM | POA: Diagnosis not present

## 2022-01-11 DIAGNOSIS — E119 Type 2 diabetes mellitus without complications: Secondary | ICD-10-CM | POA: Diagnosis not present

## 2022-01-11 DIAGNOSIS — I10 Essential (primary) hypertension: Secondary | ICD-10-CM

## 2022-01-11 DIAGNOSIS — I679 Cerebrovascular disease, unspecified: Secondary | ICD-10-CM | POA: Diagnosis not present

## 2022-01-11 DIAGNOSIS — I25119 Atherosclerotic heart disease of native coronary artery with unspecified angina pectoris: Secondary | ICD-10-CM | POA: Diagnosis not present

## 2022-01-11 NOTE — Progress Notes (Signed)
Cardiology Office Note:    Date:  01/11/2022   ID:  Mackenzie Gibbs, DOB 11-04-1940, MRN [id number]  PCP:  Lavone Orn, MD  Cardiologist:  Sinclair Grooms, MD   Referring MD: Lavone Orn, MD   Chief Complaint  Patient presents with   Coronary Artery Disease   Hypertension    History of Present Illness:    Mackenzie Gibbs is a 81 y.o. female with a hx of diabetes type II, essential hypertension, cerebrovascular disease,  Hyperlipidemia, CAD with prior first-generation DES to circumflex / right coronary artery 2004, and essential hypertension.   Mackenzie Gibbs is doing well.  She remains very active.  Just turned 81 years of age.  Has not had any chest discomfort.  Had DES implantation in 2004 to right coronary and circumflex.  Since that time, she has had no recurrent ischemic events or symptoms suggestive of angina.  She denies claudication.  No orthopnea PND.  No episodes of syncope.  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;  Service: General;  Laterality: Right;   cornary artery stents     2   TONSILLECTOMY      Current Medications: Current Meds  Medication Sig   anastrozole (ARIMIDEX) 1 MG tablet TAKE 1 TABLET(1 MG) BY MOUTH DAILY   aspirin EC 81 MG tablet Take 81 mg by mouth daily.   atorvastatin  (LIPITOR) 40 MG tablet Take 40 mg by mouth daily.   beta carotene w/minerals (OCUVITE) tablet Take 1 tablet by mouth daily.   COVID-19 mRNA vaccine, Moderna, (MODERNA COVID-19 VACCINE) 100 MCG/0.5ML injection Inject into the muscle.   cycloSPORINE (RESTASIS) 0.05 % ophthalmic emulsion Place 1 drop into both eyes daily.    glucose blood test strip 1 each by Other route as needed for other. Use as instructed   Lancets 30G MISC by Does not apply route. ONE TOUCH ULTRA 250.00   lisinopril-hydrochlorothiazide (PRINZIDE,ZESTORETIC) 10-12.5 MG per tablet Take 1 tablet by mouth daily.   metFORMIN (GLUCOPHAGE) 1000 MG tablet Take 1,000 mg by mouth 2 (two) times daily.   minoxidil (LONITEN) 2.5 MG tablet Take 1.25 mg by mouth daily.   nitroGLYCERIN (NITROSTAT) 0.4 MG SL tablet Place 1 tablet (0.4 mg total) under the tongue every 5 (five) minutes as needed for chest pain.     Allergies:   Dristan cold [chlorphen-pe-acetaminophen] and Kiwi extract   Social History   Socioeconomic History   Marital status: Divorced    Spouse name: Not on file   Number of children: Not on file   Years of education: Not on file   Highest education level: Not on file  Occupational History   Not on file  Tobacco Use   Smoking status: Former   Smokeless tobacco: Never  Vaping Use   Vaping  Use: Never used  Substance and Sexual Activity   Alcohol use: Yes    Alcohol/week: 0.0 standard drinks of alcohol    Comment: social   Drug use: No   Sexual activity: Not on file  Other Topics Concern   Not on file  Social History Narrative   Not on file   Social Determinants of Health   Financial Resource Strain: Not on file  Food Insecurity: Not on file  Transportation Needs: Not on file  Physical Activity: Not on file  Stress: Not on file  Social Connections: Not on file     Family History: The patient's family history includes Alcohol abuse in an other family member; Arthritis in an other family member; Breast  cancer in an other family member; Breast cancer (age of onset: 42) in her mother; Coronary artery disease in her brother; Diabetes in some other family members; Hypertension in her mother and another family member; Stroke in her father.  ROS:   Please see the history of present illness.    Lives alone.  Has no falls.  All other systems reviewed and are negative.  EKGs/Labs/Other Studies Reviewed:    The following studies were reviewed today: No recent imaging  EKG:  EKG sinus rhythm, poor R wave progression V1 through V4, left anterior hemiblock, LVH.  Compared to prior most recently performed in 12/18/2020, no significant changes noted.  Recent Labs: 05/20/2021: ALT 21; BUN 16; Creatinine, Ser 0.82; Hemoglobin 13.2; Platelets 219; Potassium 3.6; Sodium 143  Recent Lipid Panel No results found for: "CHOL", "TRIG", "HDL", "CHOLHDL", "VLDL", "LDLCALC", "LDLDIRECT"  Physical Exam:    VS:  BP 108/64   Pulse 66   Ht '5\' 8"'$  (1.727 m)   Wt 136 lb 9.6 oz (62 kg)   SpO2 99%   BMI 20.77 kg/m     Wt Readings from Last 3 Encounters:  01/11/22 136 lb 9.6 oz (62 kg)  01/05/22 135 lb 14.4 oz (61.6 kg)  05/20/21 132 lb 12.8 oz (60.2 kg)    Blood pressure repeated and by me 128/78 mmHg. GEN: Appears younger than stated age. No acute distress HEENT: Normal NECK: No JVD. LYMPHATICS: No lymphadenopathy CARDIAC: No murmur. RRR no gallop, or edema. VASCULAR: Diminished pulses in both feet.  No bruits. RESPIRATORY:  Clear to auscultation without rales, wheezing or rhonchi  ABDOMEN: Soft, non-tender, non-distended, No pulsatile mass, MUSCULOSKELETAL: No deformity  SKIN: Warm and dry NEUROLOGIC:  Alert and oriented x 3 PSYCHIATRIC:  Normal affect   ASSESSMENT:    1. Atherosclerosis of native coronary artery of native heart with angina pectoris ([place])   2. Essential hypertension   3. Other hyperlipidemia   4. Diabetes mellitus without complication ([place])   5. Cerebrovascular disease    PLAN:     In order of problems listed above:  Secondary prevention reviewed.  Encouraged continued safe physical activity. Excellent on current regimen.  Continue Zestoretic 10/12.5 mg daily. Continue high intensity statin therapy with target LDL goal less than 70. On Glucophage.  If dyspnea, consider SGLT2 therapy. Continue aspirin   Overall education and awareness concerning secondary risk prevention was discussed in detail: LDL less than 70, hemoglobin A1c less than 7, blood pressure target less than 130/80 mmHg, >150 minutes of moderate aerobic activity per week, avoidance of smoking, weight control (via diet and exercise), and continued surveillance/management of/for obstructive sleep apnea.    Medication Adjustments/Labs and Tests Ordered: Current medicines are reviewed at length with the patient today.  Concerns  regarding medicines are outlined above.  Orders Placed This Encounter  Procedures   EKG 12-Lead   No orders of the defined types were placed in this encounter.   Patient Instructions  Medication Instructions:  Your physician recommends that you continue on your current medications as directed. Please refer to the Current Medication list given to you today.  *If you need a refill on your cardiac medications before your next appointment, please call your pharmacy*  Lab Work: NONE  Testing/Procedures: NONE  Follow-Up: At [facility], you and your health needs are our priority.  As part of our continuing mission to provide you with exceptional heart care, we have created designated Provider Care Teams.  These Care Teams include your primary Cardiologist (physician) and Advanced Practice Providers (APPs -  Physician Assistants and Nurse Practitioners) who all work together to provide you with the care you need, when you need it.  Your next appointment:   1 year(s)  The format for your next appointment:   In Person  Provider:   Sinclair Grooms, MD    Important Information About Sugar         Signed, Sinclair Grooms, MD  01/11/2022 4:50 PM    [place]

## 2022-01-11 NOTE — Patient Instructions (Signed)
Medication Instructions:  Your physician recommends that you continue on your current medications as directed. Please refer to the Current Medication list given to you today.  *If you need a refill on your cardiac medications before your next appointment, please call your pharmacy*  Lab Work: NONE  Testing/Procedures: NONE  Follow-Up: At [facility], you and your health needs are our priority.  As part of our continuing mission to provide you with exceptional heart care, we have created designated Provider Care Teams.  These Care Teams include your primary Cardiologist (physician) and Advanced Practice Providers (APPs -  Physician Assistants and Nurse Practitioners) who all work together to provide you with the care you need, when you need it.  Your next appointment:   1 year(s)  The format for your next appointment:   In Person  Provider:   Henry W Smith III, MD    Important Information About Sugar       

## 2022-03-08 ENCOUNTER — Other Ambulatory Visit: Payer: Self-pay | Admitting: Internal Medicine

## 2022-03-08 ENCOUNTER — Ambulatory Visit
Admission: RE | Admit: 2022-03-08 | Discharge: 2022-03-08 | Disposition: A | Discharge status: Home / Self Care | Payer: Medicare PPO | Source: Ambulatory Visit | Attending: Internal Medicine | Admitting: Internal Medicine

## 2022-03-08 DIAGNOSIS — M160 Bilateral primary osteoarthritis of hip: Secondary | ICD-10-CM | POA: Diagnosis not present

## 2022-03-08 DIAGNOSIS — R0989 Other specified symptoms and signs involving the circulatory and respiratory systems: Secondary | ICD-10-CM | POA: Diagnosis not present

## 2022-03-08 DIAGNOSIS — Z23 Encounter for immunization: Secondary | ICD-10-CM | POA: Diagnosis not present

## 2022-03-08 DIAGNOSIS — M549 Dorsalgia, unspecified: Secondary | ICD-10-CM | POA: Diagnosis not present

## 2022-03-08 DIAGNOSIS — I739 Peripheral vascular disease, unspecified: Secondary | ICD-10-CM | POA: Diagnosis not present

## 2022-03-08 DIAGNOSIS — M47816 Spondylosis without myelopathy or radiculopathy, lumbar region: Secondary | ICD-10-CM | POA: Diagnosis not present

## 2022-03-08 DIAGNOSIS — M25552 Pain in left hip: Secondary | ICD-10-CM

## 2022-03-08 DIAGNOSIS — M8588 Other specified disorders of bone density and structure, other site: Secondary | ICD-10-CM | POA: Diagnosis not present

## 2022-03-08 DIAGNOSIS — M25473 Effusion, unspecified ankle: Secondary | ICD-10-CM | POA: Diagnosis not present

## 2022-03-12 DIAGNOSIS — R0989 Other specified symptoms and signs involving the circulatory and respiratory systems: Secondary | ICD-10-CM | POA: Diagnosis not present

## 2022-04-20 ENCOUNTER — Ambulatory Visit
Admission: RE | Admit: 2022-04-20 | Discharge: 2022-04-20 | Disposition: A | Discharge status: Home / Self Care | Payer: Medicare PPO | Source: Ambulatory Visit | Attending: Hematology and Oncology | Admitting: Hematology and Oncology

## 2022-04-20 ENCOUNTER — Other Ambulatory Visit: Payer: Self-pay | Admitting: Hematology and Oncology

## 2022-04-20 DIAGNOSIS — C50411 Malignant neoplasm of upper-outer quadrant of right female breast: Secondary | ICD-10-CM

## 2022-04-24 IMAGING — MG DIGITAL DIAGNOSTIC BILAT W/ TOMO W/ CAD
6 of 11 series · 6 of 31 positions shown · non-contrast
Comparison: Previous exam(s).

CLINICAL DATA: 80-year-old female status post malignant right
lumpectomy in 7478.

EXAM:
DIGITAL DIAGNOSTIC BILATERAL MAMMOGRAM WITH TOMOSYNTHESIS AND CAD
TECHNIQUE: Bilateral digital diagnostic mammography and breast tomosynthesis
was performed. The images were evaluated with computer-aided
detection.

[R CC]
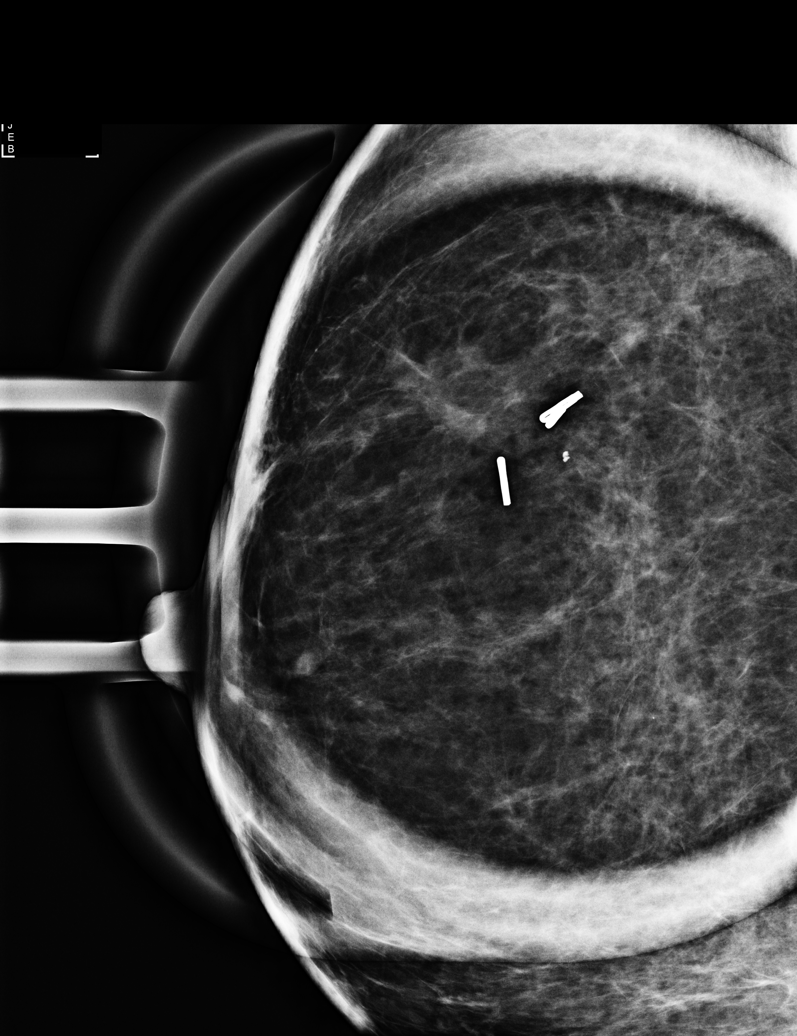

[R MLO synth-2D (1 of 2)]
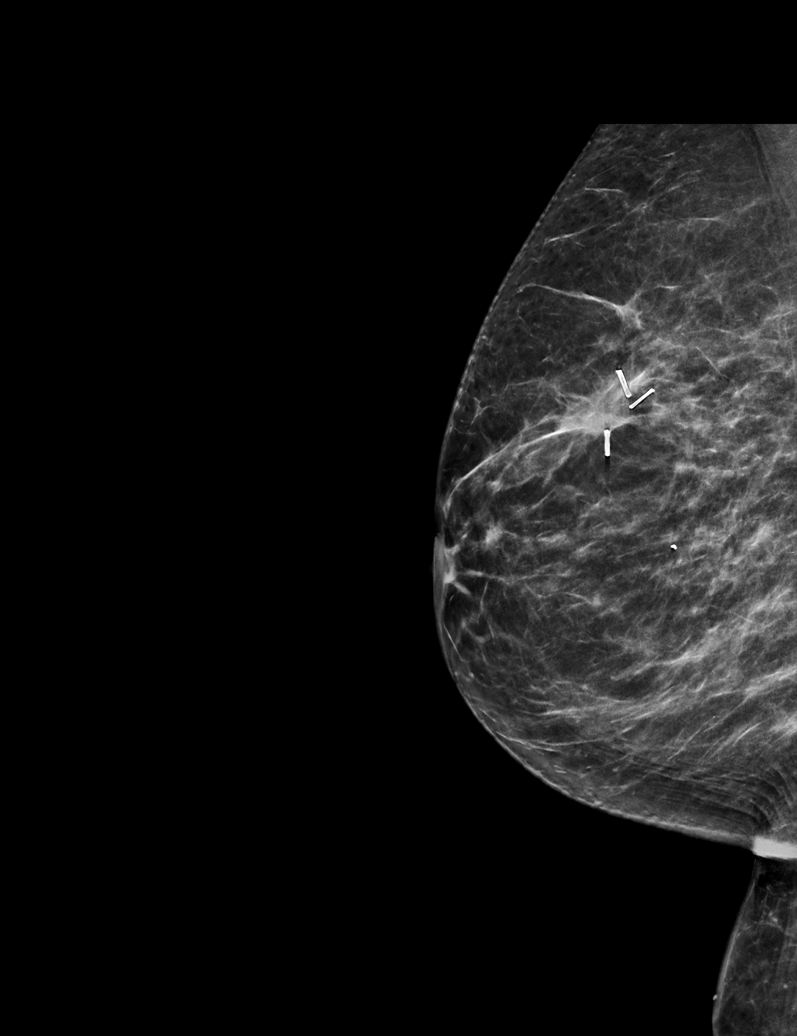

[R MLO synth-2D (2 of 2)]
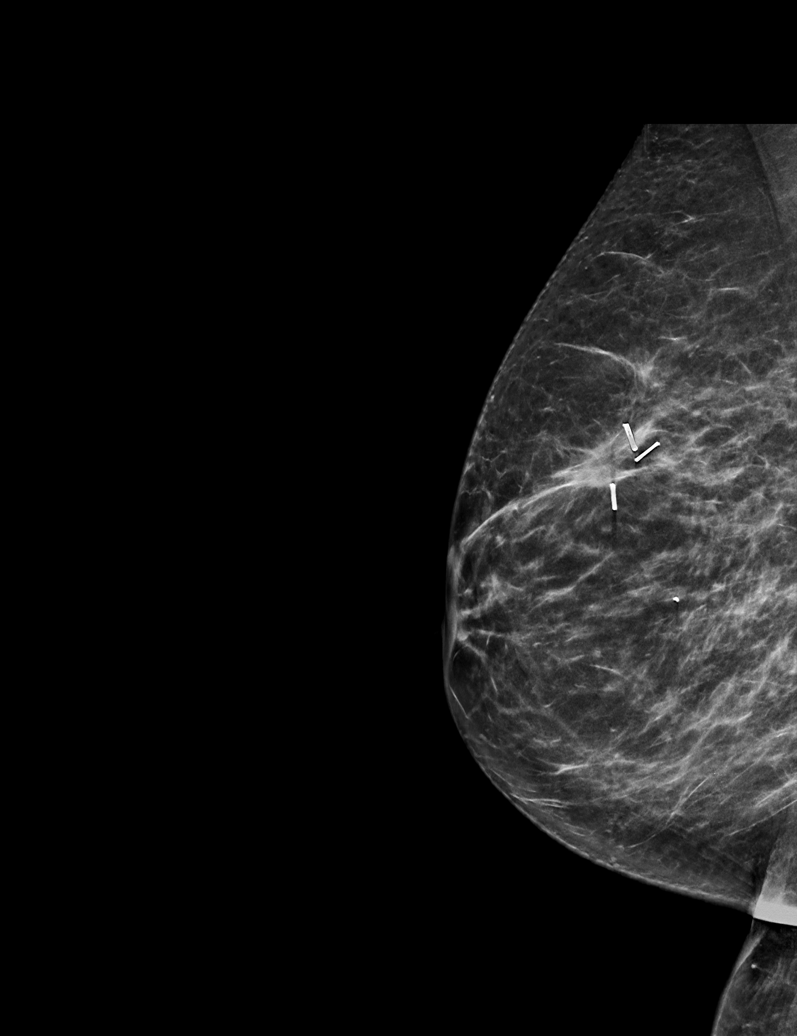

[L MLO synth-2D]
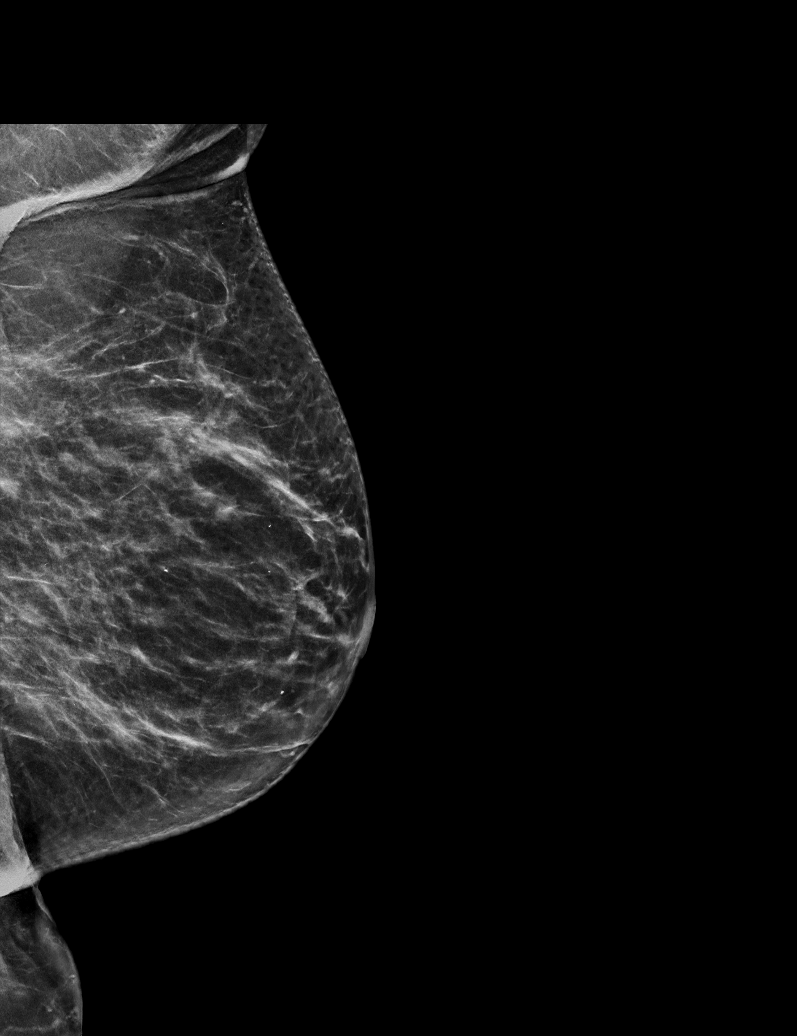

[R CC synth-2D]
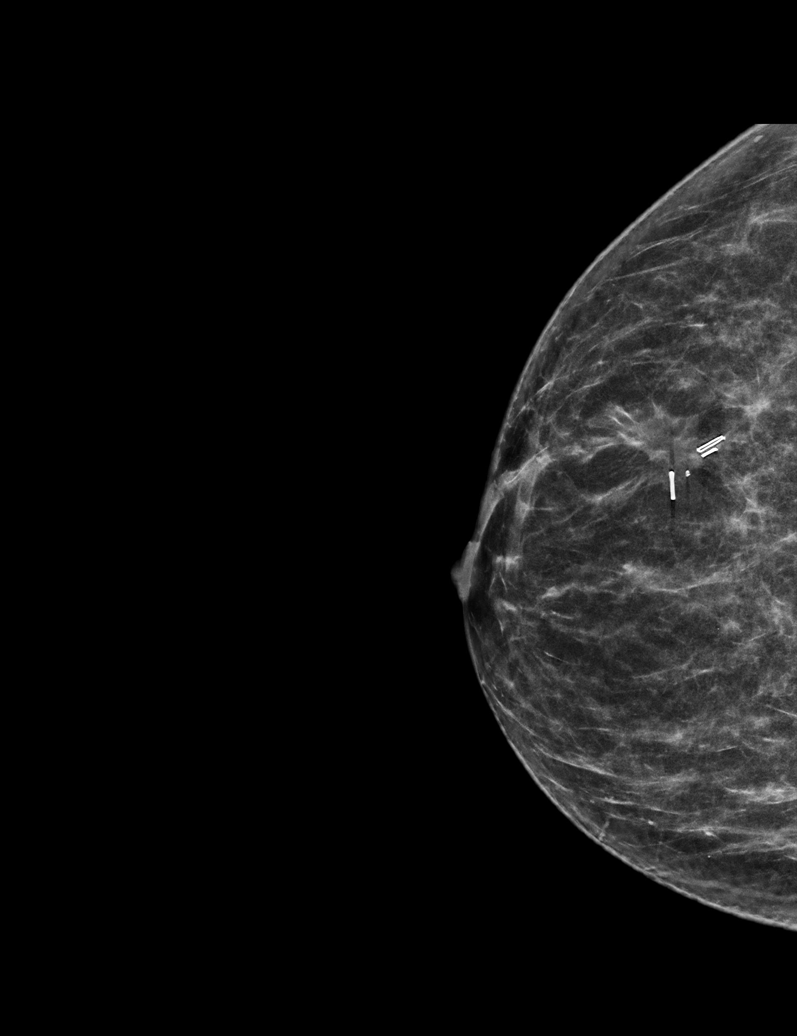

[L CC synth-2D]
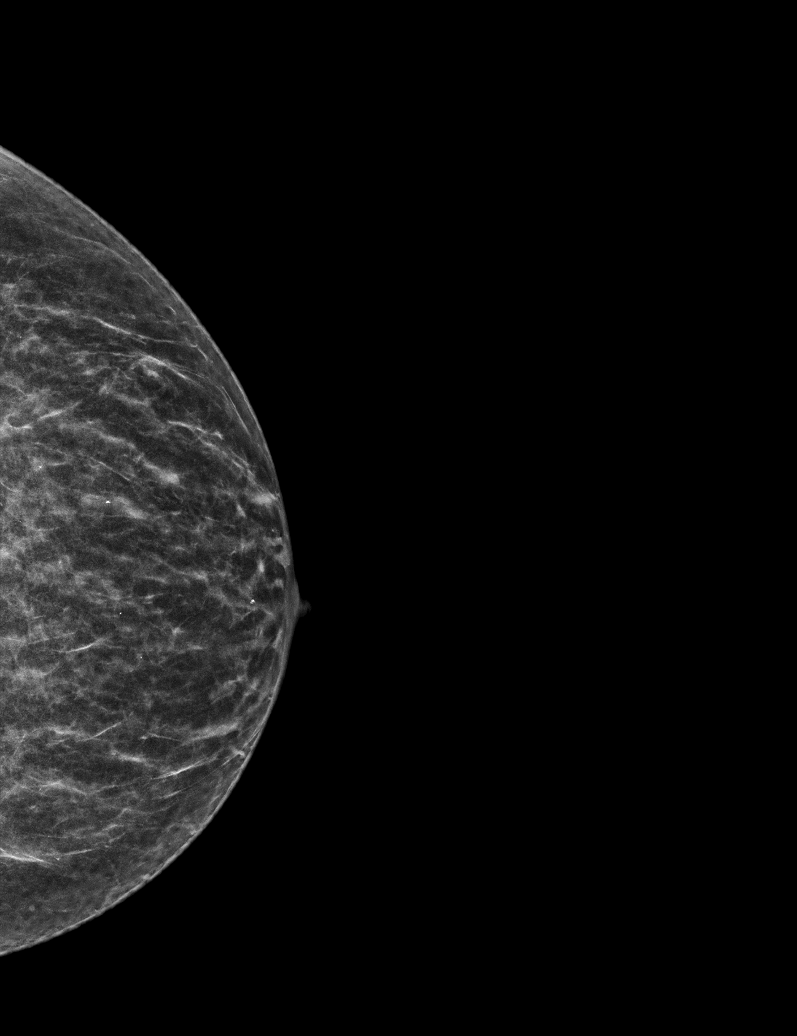

[6 of 31 positions shown; findings below may reference images not displayed]

ACR Breast Density Category b: There are scattered areas of
fibroglandular density.
FINDINGS: Stable right breast posttreatment changes. No new or suspicious
findings in either breast. The parenchymal pattern is stable.
IMPRESSION: 1. No mammographic evidence of malignancy in either breast.
2. Stable right breast posttreatment changes.

RECOMMENDATION:
Per protocol, as the patient is now 2 or more years status post
lumpectomy, she may return to annual screening mammography in 1
year. However, given the history of breast cancer, the patient
remains eligible for annual diagnostic mammography if preferred.

I have discussed the findings and recommendations with the patient.
If applicable, a reminder letter will be sent to the patient
regarding the next appointment.

BI-RADS CATEGORY  2: Benign.

## 2022-07-28 DIAGNOSIS — M1990 Unspecified osteoarthritis, unspecified site: Secondary | ICD-10-CM | POA: Diagnosis not present

## 2022-07-28 DIAGNOSIS — M8580 Other specified disorders of bone density and structure, unspecified site: Secondary | ICD-10-CM | POA: Diagnosis not present

## 2022-07-28 DIAGNOSIS — I2510 Atherosclerotic heart disease of native coronary artery without angina pectoris: Secondary | ICD-10-CM | POA: Diagnosis not present

## 2022-07-28 DIAGNOSIS — E1159 Type 2 diabetes mellitus with other circulatory complications: Secondary | ICD-10-CM | POA: Diagnosis not present

## 2022-07-28 DIAGNOSIS — C50919 Malignant neoplasm of unspecified site of unspecified female breast: Secondary | ICD-10-CM | POA: Diagnosis not present

## 2022-07-28 DIAGNOSIS — E785 Hyperlipidemia, unspecified: Secondary | ICD-10-CM | POA: Diagnosis not present

## 2022-07-28 DIAGNOSIS — I10 Essential (primary) hypertension: Secondary | ICD-10-CM | POA: Diagnosis not present

## 2022-07-28 DIAGNOSIS — Z87891 Personal history of nicotine dependence: Secondary | ICD-10-CM | POA: Diagnosis not present

## 2022-07-28 DIAGNOSIS — Z8249 Family history of ischemic heart disease and other diseases of the circulatory system: Secondary | ICD-10-CM | POA: Diagnosis not present

## 2022-11-15 DIAGNOSIS — E119 Type 2 diabetes mellitus without complications: Secondary | ICD-10-CM | POA: Diagnosis not present

## 2022-11-15 DIAGNOSIS — H2513 Age-related nuclear cataract, bilateral: Secondary | ICD-10-CM | POA: Diagnosis not present

## 2022-11-15 DIAGNOSIS — H5203 Hypermetropia, bilateral: Secondary | ICD-10-CM | POA: Diagnosis not present

## 2022-11-23 DIAGNOSIS — I10 Essential (primary) hypertension: Secondary | ICD-10-CM | POA: Diagnosis not present

## 2022-11-23 DIAGNOSIS — I679 Cerebrovascular disease, unspecified: Secondary | ICD-10-CM | POA: Diagnosis not present

## 2022-11-23 DIAGNOSIS — E1151 Type 2 diabetes mellitus with diabetic peripheral angiopathy without gangrene: Secondary | ICD-10-CM | POA: Diagnosis not present

## 2022-11-23 DIAGNOSIS — I2510 Atherosclerotic heart disease of native coronary artery without angina pectoris: Secondary | ICD-10-CM | POA: Diagnosis not present

## 2022-11-23 DIAGNOSIS — E7800 Pure hypercholesterolemia, unspecified: Secondary | ICD-10-CM | POA: Diagnosis not present

## 2022-11-23 DIAGNOSIS — C50911 Malignant neoplasm of unspecified site of right female breast: Secondary | ICD-10-CM | POA: Diagnosis not present

## 2023-01-06 ENCOUNTER — Inpatient Hospital Stay: Payer: Medicare PPO | Admitting: Hematology and Oncology

## 2023-01-13 ENCOUNTER — Inpatient Hospital Stay: Payer: Medicare PPO | Attending: Hematology and Oncology | Admitting: Hematology and Oncology

## 2023-01-13 VITALS — BP 131/62 | HR 83 | Temp 97.3°F | Resp 16 | Wt 139.3 lb

## 2023-01-13 DIAGNOSIS — Z79811 Long term (current) use of aromatase inhibitors: Secondary | ICD-10-CM | POA: Insufficient documentation

## 2023-01-13 DIAGNOSIS — Z170 Estrogen receptor positive status [ER+]: Secondary | ICD-10-CM | POA: Diagnosis not present

## 2023-01-13 DIAGNOSIS — C50411 Malignant neoplasm of upper-outer quadrant of right female breast: Secondary | ICD-10-CM | POA: Insufficient documentation

## 2023-01-13 NOTE — Progress Notes (Signed)
[facility]  [phone] Fax:([phone]     ID: Mackenzie Gibbs DOB: [birth year]  MR#: [id number]  DGU#:[id number]  Patient Care Team: Mackenzie Funk, Gibbs (Inactive) as PCP - General (Internal Medicine) Mackenzie Records, Gibbs (Inactive) as PCP - Cardiology (Cardiology) Mackenzie Angelica, RN as Oncology Nurse Navigator Mackenzie Proud, RN as Oncology Nurse Navigator Mackenzie Loron, Gibbs as Consulting Physician (General Surgery) Mackenzie Gibbs, Mackenzie Hue, Gibbs (Inactive) as Consulting Physician (Oncology) Mackenzie Puffer, Gibbs as Consulting Physician (Radiation Oncology) Mackenzie Records, Gibbs (Inactive) as Consulting Physician (Cardiology) Mackenzie Du, Gibbs as Consulting Physician (Ophthalmology) Mackenzie Moulds, Gibbs OTHER Gibbs:  CHIEF COMPLAINT: estrogen receptor positive breast cancer  CURRENT TREATMENT: Anastrozole   INTERVAL HISTORY:  Mackenzie Gibbs returns today for follow up of her estrogen receptor positive breast cancer.  The patient, with a history of breast cancer, presents with itching of the nipple on the side of previous surgery. The itching is intermittent and seems to coincide with the time she has some vaginal discharge. She also reports a brownish vaginal drainage, which also seems to occur around the same time.  She has consulted a gynecologist who took a sample and reported it as normal vaginal drainage.  The patient also reports a lack of energy and a tendency to stay in bed until late in the morning. She is currently taking anastrozole and minoxidil. Rest of the pertinent 10 point ROS reviewed and negative.   COVID 19 VACCINATION STATUS: fully vaccinated Mackenzie Gibbs) with booster x2 as of August 2022  HISTORY OF CURRENT ILLNESS: From the original intake note:  Mackenzie Gibbs had routine screening mammography on 04/13/2019 showing a possible abnormality in the right breast. She underwent right diagnostic mammography with tomography and right breast  ultrasonography at [facility] on 04/19/2019 showing: breast density category B; suspicious 0.6 cm right breast mass at 10 o'clock; no suspicious-appearing lymph nodes.  Accordingly on 04/20/2019 she proceeded to biopsy of the right breast area in question. The pathology from this procedure ([id number]) showed: invasive mammary carcinoma with lobular features, grade 2, e-cadherin weakly positive.  (This was presented as lobular on conference 05/02/2019).  Prognostic indicators significant for: estrogen receptor, 90% positive and progesterone receptor, 95% positive, both with strong staining intensity. Proliferation marker Ki67 at 25%. HER2 negative by immunohistochemistry (1+).  The patient's subsequent history is as detailed below.   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   WITH RCA AND CIRCUMFLEX TAXUS DES   Cerebrovascular disease    Diabetes mellitus without complication (HCC)    Dry eyes    Hyperlipidemia    Hypertension    Reflux    Stenosis of left vertebral artery    75%    PAST SURGICAL HISTORY: Past Surgical History:  Procedure Laterality Date   ABDOMINAL HYSTERECTOMY  1988   partial, per patient   BREAST LUMPECTOMY WITH RADIOACTIVE SEED AND SENTINEL LYMPH NODE BIOPSY Right 05/24/2019   Procedure: RADIOACTIVE SEED GUIDED RIGHT BREAST LUMPECTOMY, RIGHT AXIALLARY SENTINEL LYMPH NODE BIOPSY;  Surgeon: Mackenzie Loron, Gibbs;  Location: [facility];  Service: General;  Laterality: Right;   cornary artery stents     2   TONSILLECTOMY      FAMILY HISTORY: Family History  Problem Relation Age of Onset   Stroke Father    Hypertension Mother    Breast cancer Mother 14   Diabetes Other  Hypertension Other    Arthritis Other    Breast cancer Other    Diabetes Other    Coronary artery disease Brother    Alcohol abuse Other   Patient's father was 54  years old when he died from cerebral hemorrhage. Patient's mother died from breast cancer at age 92. She was diagnosed at age 41. The patient denies a family hx of ovarian cancer. She does note a maternal aunt with "stomach" cancer. She has 1 brother.   GYNECOLOGIC HISTORY:  No LMP recorded. Patient is postmenopausal. Menarche: 82 years old Age at first live birth: 82 years old GX P 3 LMP age 52 due to hysterectomy Contraceptive: never used HRT never used  Hysterectomy? Yes, for uterine fibroids BSO? 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 She teaches piano and organ at  [employer] and regularly plays at her church and at funerals. She is divorced. She lives at home by herself, with no pets. Son Koleen Nimrod III, age 71, is an Stage manager in [place], [place]. Daughter Jetty Duhamel M.D., age 65, is a podiatrist in [place], [place]. Daughter Lowella Dell, age 68, is an event planner in [place]. [name] is status post bilateral mastectomies for breast cancer.  The patient has two grandchildren and one great-grandchild. She attends [employer].    ADVANCED DIRECTIVES: Not addressed   HEALTH MAINTENANCE: Social History   Tobacco Use   Smoking status: Former   Smokeless tobacco: Never  Vaping Use   Vaping status: Never Used  Substance Use Topics   Alcohol use: Yes    Alcohol/week: 0.0 standard drinks of alcohol    Comment: social   Drug use: No     Colonoscopy: 2017  PAP: none on file  Bone density: date unknown   Allergies  Allergen Reactions   Dristan Cold [Chlorphen-Pe-Acetaminophen]     EYES PUFFY   Kiwi Extract     DEATHLY ILL    Current Outpatient Medications  Medication Sig Dispense Refill   anastrozole (ARIMIDEX) 1 MG tablet TAKE 1 TABLET(1 MG) BY MOUTH DAILY 90 tablet 4   aspirin EC 81 MG tablet Take 81 mg by mouth daily.     atorvastatin (LIPITOR) 40 MG tablet Take 40 mg by mouth daily.      beta carotene w/minerals (OCUVITE) tablet Take 1 tablet by mouth daily.     COVID-19 mRNA vaccine, Moderna, (MODERNA COVID-19 VACCINE) 100 MCG/0.5ML injection Inject into the muscle. 0.25 mL 0   cycloSPORINE (RESTASIS) 0.05 % ophthalmic emulsion Place 1 drop into both eyes daily.      glucose blood test strip 1 each by Other route as needed for other. Use as instructed     Lancets 30G MISC by Does not apply route. ONE TOUCH ULTRA 250.00     lisinopril-hydrochlorothiazide (PRINZIDE,ZESTORETIC) 10-12.5 MG per tablet Take 1 tablet by mouth daily.     metFORMIN (GLUCOPHAGE) 1000 MG tablet Take 1,000 mg by mouth 2 (two) times daily.     minoxidil (LONITEN) 2.5 MG tablet Take 1.25 mg by mouth daily.     nitroGLYCERIN (NITROSTAT) 0.4 MG SL tablet Place 1 tablet (0.4 mg total) under the tongue every 5 (five) minutes as needed for chest pain. 25 tablet 1   No current facility-administered medications for this visit.    OBJECTIVE:  African-American woman who appears stated age  Vitals:   01/13/23 1357  BP: 131/62  Pulse: 83  Resp: 16  Temp: (!) 97.3 F (36.3 C)  SpO2: 98%      Body mass index is 21.18 kg/m.   Wt Readings from Last 3 Encounters:  01/13/23 139 lb 4.8 oz (63.2 kg)  01/11/22 136 lb 9.6 oz (62 kg)  01/05/22 135 lb 14.4 oz (61.6 kg)      ECOG FS:1 - Symptomatic but completely ambulatory  Physical Exam Constitutional:      Appearance: Normal appearance.  Chest:     Comments: Bilateral breasts inspected.  No palpable masses.  No regional adenopathy. Both nipples appear healthy. Musculoskeletal:     Cervical back: Normal range of motion. No rigidity.  Lymphadenopathy:     Cervical: No cervical adenopathy.  Neurological:     Mental Status: She is alert.       LAB RESULTS:  CMP     Component Value Date/Time   NA 143 05/20/2021 1325   K 3.6 05/20/2021 1325   CL 105 05/20/2021 1325   CO2 30 05/20/2021 1325   GLUCOSE 107 (H) 05/20/2021 1325   BUN 16  05/20/2021 1325   CREATININE 0.82 05/20/2021 1325   CREATININE 0.93 05/02/2019 1221   CALCIUM 9.9 05/20/2021 1325   PROT 7.4 05/20/2021 1325   ALBUMIN 4.5 05/20/2021 1325   AST 18 05/20/2021 1325   AST 14 (L) 05/02/2019 1221   ALT 21 05/20/2021 1325   ALT 17 05/02/2019 1221   ALKPHOS 70 05/20/2021 1325   BILITOT 0.3 05/20/2021 1325   BILITOT 0.3 05/02/2019 1221   GFRNONAA >60 05/20/2021 1325   GFRNONAA 59 (L) 05/02/2019 1221   GFRAA >60 07/11/2019 1340   GFRAA >60 05/02/2019 1221    No results found for: "TOTALPROTELP", "ALBUMINELP", "A1GS", "A2GS", "BETS", "BETA2SER", "GAMS", "MSPIKE", "SPEI"  Lab Results  Component Value Date   WBC 5.5 05/20/2021   NEUTROABS 2.9 05/20/2021   HGB 13.2 05/20/2021   HCT 40.9 05/20/2021   MCV 89.1 05/20/2021   PLT 219 05/20/2021    No results found for: "LABCA2"  No components found for: "KZSWFU932"  No results for input(s): "INR" in the last 168 hours.  No results found for: "LABCA2"  No results found for: "TFT732"  No results found for: "CAN125"  No results found for: "CAN153"  No results found for: "CA2729"  No components found for: "HGQUANT"  No results found for: "CEA1", "CEA" / No results found for: "CEA1", "CEA"   No results found for: "AFPTUMOR"  No results found for: "CHROMOGRNA"  No results found for: "KPAFRELGTCHN", "LAMBDASER", "KAPLAMBRATIO" (kappa/lambda light chains)  No results found for: "HGBA", "HGBA2QUANT", "HGBFQUANT", "HGBSQUAN" (Hemoglobinopathy evaluation)   No results found for: "LDH"  No results found for: "IRON", "TIBC", "IRONPCTSAT" (Iron and TIBC)  No results found for: "FERRITIN"  Urinalysis No results found for: "COLORURINE", "APPEARANCEUR", "LABSPEC", "PHURINE", "GLUCOSEU", "HGBUR", "BILIRUBINUR", "KETONESUR", "PROTEINUR", "UROBILINOGEN", "NITRITE", "LEUKOCYTESUR"   STUDIES: No results found.    ELIGIBLE FOR AVAILABLE RESEARCH PROTOCOL: no  ASSESSMENT: 82 y.o. [place] woman  status post right breast upper outer quadrant biopsy 04/20/2019 for a clinical T1b N0, stage IA invasive lobular carcinoma (E-cadherin weakly positive), grade 2, estrogen and progesterone receptor positive, HER-2 not amplified, with an MIB-1 of 25%  (1) status post right lumpectomy and sentinel lymph node sampling 05/24/2019 for a pT1C pN0, stage 1A invasive lobular breast cancer, grade 2, with negative margins  (a) a total of 3 axillary lymph nodes were removed  (2) Oncotype score of 16 predicts  a risk of recurrence outside the breast in the next 9 years of 4% if the patient's only systemic therapy is antiestrogens for 5 years  (3) patient opted against adjuvant radiation  (4) anastrozole started 07/11/2019  (a) [facility] showed a T score of -1.0    PLAN:   Breast Cancer Post-operative patient on Anastrozole with no new complaints. Noted occasional itching at the surgical site, but physical examination revealed no abnormalities. -Continue Anastrozole as prescribed. -Order mammogram for January 2025 as part of routine follow-up.  Vaginal Discharge Brownish discharge noted, more prominent around the time of the patient's previous menstrual cycle. Gynecologist previously evaluated and deemed it to be normal vaginal drainage. -No changes to current management. Continue to follow up with gynecology.  Bone Health Last bone density scan in February 2022 was normal. -Order bone density scan to coincide with mammogram appointment in January 2025.  General Health Maintenance -Encourage daily walking for overall health. -Continue current vitamin regimen (Centrum). -Schedule follow-up appointment for next year, with instruction to contact the office if any changes or concerns arise.  Mackenzie Moulds, Gibbs   01/13/2023 4:43 PM Medical Oncology and Hematology [facility] [address] [place], [place] [address] Tel. [phone]    Fax.  [phone]  Total time spent: 30 min  *Total Encounter Time as defined by the Centers for Medicare and Medicaid Services includes, in addition to the face-to-face time of a patient visit (documented in the note above) non-face-to-face time: obtaining and reviewing outside history, ordering and reviewing medications, tests or procedures, care coordination (communications with other health care professionals or caregivers) and documentation in the medical record.

## 2023-02-01 ENCOUNTER — Encounter: Payer: Self-pay | Admitting: Cardiology

## 2023-02-01 ENCOUNTER — Ambulatory Visit: Payer: Medicare PPO | Attending: Cardiology | Admitting: Cardiology

## 2023-02-01 ENCOUNTER — Ambulatory Visit: Payer: Medicare PPO | Admitting: Cardiology

## 2023-02-01 VITALS — BP 120/60 | HR 75 | Ht 65.0 in | Wt 136.0 lb

## 2023-02-01 DIAGNOSIS — E119 Type 2 diabetes mellitus without complications: Secondary | ICD-10-CM

## 2023-02-01 DIAGNOSIS — E7849 Other hyperlipidemia: Secondary | ICD-10-CM | POA: Diagnosis not present

## 2023-02-01 DIAGNOSIS — I25119 Atherosclerotic heart disease of native coronary artery with unspecified angina pectoris: Secondary | ICD-10-CM | POA: Diagnosis not present

## 2023-02-01 DIAGNOSIS — I10 Essential (primary) hypertension: Secondary | ICD-10-CM | POA: Diagnosis not present

## 2023-02-01 NOTE — Patient Instructions (Signed)
Medication Instructions:  No changes Continue taking medication  *If you need a refill on your cardiac medications before your next appointment, please call your pharmacy*   Lab Work: None    Testing/Procedures: None    Follow-Up: At [facility], you and your health needs are our priority.  As part of our continuing mission to provide you with exceptional heart care, we have created designated Provider Care Teams.  These Care Teams include your primary Cardiologist (physician) and Advanced Practice Providers (APPs -  Physician Assistants and Nurse Practitioners) who all work together to provide you with the care you need, when you need it.      Your next appointment:   1 year(s)  Provider:   Dr. [place]

## 2023-03-17 DIAGNOSIS — L219 Seborrheic dermatitis, unspecified: Secondary | ICD-10-CM | POA: Diagnosis not present

## 2023-03-17 DIAGNOSIS — L658 Other specified nonscarring hair loss: Secondary | ICD-10-CM | POA: Diagnosis not present

## 2023-04-21 DIAGNOSIS — Z01419 Encounter for gynecological examination (general) (routine) without abnormal findings: Secondary | ICD-10-CM | POA: Diagnosis not present

## 2023-04-21 DIAGNOSIS — Z853 Personal history of malignant neoplasm of breast: Secondary | ICD-10-CM | POA: Diagnosis not present

## 2023-04-26 ENCOUNTER — Encounter: Payer: Self-pay | Admitting: Hematology and Oncology

## 2023-04-27 ENCOUNTER — Ambulatory Visit
Admission: RE | Admit: 2023-04-27 | Discharge: 2023-04-27 | Disposition: A | Discharge status: Home / Self Care | Payer: Medicare PPO | Source: Ambulatory Visit | Attending: Hematology and Oncology

## 2023-04-27 ENCOUNTER — Ambulatory Visit
Admission: RE | Admit: 2023-04-27 | Discharge: 2023-04-27 | Disposition: A | Discharge status: Home / Self Care | Payer: Medicare PPO | Source: Ambulatory Visit | Attending: Hematology and Oncology | Admitting: Hematology and Oncology

## 2023-04-27 ENCOUNTER — Other Ambulatory Visit: Payer: Self-pay | Admitting: Hematology and Oncology

## 2023-04-27 ENCOUNTER — Other Ambulatory Visit: Payer: Self-pay | Admitting: *Deleted

## 2023-04-27 DIAGNOSIS — C50411 Malignant neoplasm of upper-outer quadrant of right female breast: Secondary | ICD-10-CM

## 2023-04-27 DIAGNOSIS — N644 Mastodynia: Secondary | ICD-10-CM | POA: Diagnosis not present

## 2023-04-27 MED ORDER — ANASTROZOLE 1 MG PO TABS: ORAL_TABLET | ORAL | 4 refills | Status: AC

## 2023-05-31 DIAGNOSIS — Z23 Encounter for immunization: Secondary | ICD-10-CM | POA: Diagnosis not present

## 2023-05-31 DIAGNOSIS — Z8673 Personal history of transient ischemic attack (TIA), and cerebral infarction without residual deficits: Secondary | ICD-10-CM | POA: Diagnosis not present

## 2023-05-31 DIAGNOSIS — Z0000 Encounter for general adult medical examination without abnormal findings: Secondary | ICD-10-CM | POA: Diagnosis not present

## 2023-05-31 DIAGNOSIS — I739 Peripheral vascular disease, unspecified: Secondary | ICD-10-CM | POA: Diagnosis not present

## 2023-05-31 DIAGNOSIS — C50911 Malignant neoplasm of unspecified site of right female breast: Secondary | ICD-10-CM | POA: Diagnosis not present

## 2023-05-31 DIAGNOSIS — I2510 Atherosclerotic heart disease of native coronary artery without angina pectoris: Secondary | ICD-10-CM | POA: Diagnosis not present

## 2023-05-31 DIAGNOSIS — I6509 Occlusion and stenosis of unspecified vertebral artery: Secondary | ICD-10-CM | POA: Diagnosis not present

## 2023-05-31 DIAGNOSIS — Z1331 Encounter for screening for depression: Secondary | ICD-10-CM | POA: Diagnosis not present

## 2023-05-31 DIAGNOSIS — E1151 Type 2 diabetes mellitus with diabetic peripheral angiopathy without gangrene: Secondary | ICD-10-CM | POA: Diagnosis not present

## 2023-05-31 DIAGNOSIS — E7800 Pure hypercholesterolemia, unspecified: Secondary | ICD-10-CM | POA: Diagnosis not present

## 2023-05-31 DIAGNOSIS — I10 Essential (primary) hypertension: Secondary | ICD-10-CM | POA: Diagnosis not present

## 2023-06-21 DIAGNOSIS — E1136 Type 2 diabetes mellitus with diabetic cataract: Secondary | ICD-10-CM | POA: Diagnosis not present

## 2023-06-21 DIAGNOSIS — Z8249 Family history of ischemic heart disease and other diseases of the circulatory system: Secondary | ICD-10-CM | POA: Diagnosis not present

## 2023-06-21 DIAGNOSIS — I2510 Atherosclerotic heart disease of native coronary artery without angina pectoris: Secondary | ICD-10-CM | POA: Diagnosis not present

## 2023-06-21 DIAGNOSIS — D84821 Immunodeficiency due to drugs: Secondary | ICD-10-CM | POA: Diagnosis not present

## 2023-06-21 DIAGNOSIS — Z7982 Long term (current) use of aspirin: Secondary | ICD-10-CM | POA: Diagnosis not present

## 2023-06-21 DIAGNOSIS — M1990 Unspecified osteoarthritis, unspecified site: Secondary | ICD-10-CM | POA: Diagnosis not present

## 2023-06-21 DIAGNOSIS — E1122 Type 2 diabetes mellitus with diabetic chronic kidney disease: Secondary | ICD-10-CM | POA: Diagnosis not present

## 2023-06-21 DIAGNOSIS — E1151 Type 2 diabetes mellitus with diabetic peripheral angiopathy without gangrene: Secondary | ICD-10-CM | POA: Diagnosis not present

## 2023-06-21 DIAGNOSIS — E785 Hyperlipidemia, unspecified: Secondary | ICD-10-CM | POA: Diagnosis not present

## 2023-09-19 ENCOUNTER — Other Ambulatory Visit: Payer: Medicare PPO

## 2023-09-19 ENCOUNTER — Other Ambulatory Visit: Payer: Self-pay | Admitting: Hematology and Oncology

## 2023-09-19 DIAGNOSIS — C50411 Malignant neoplasm of upper-outer quadrant of right female breast: Secondary | ICD-10-CM

## 2023-11-18 DIAGNOSIS — E119 Type 2 diabetes mellitus without complications: Secondary | ICD-10-CM | POA: Diagnosis not present

## 2023-11-18 DIAGNOSIS — H524 Presbyopia: Secondary | ICD-10-CM | POA: Diagnosis not present

## 2023-11-18 DIAGNOSIS — H2513 Age-related nuclear cataract, bilateral: Secondary | ICD-10-CM | POA: Diagnosis not present

## 2023-11-30 DIAGNOSIS — C50911 Malignant neoplasm of unspecified site of right female breast: Secondary | ICD-10-CM | POA: Diagnosis not present

## 2023-11-30 DIAGNOSIS — E1151 Type 2 diabetes mellitus with diabetic peripheral angiopathy without gangrene: Secondary | ICD-10-CM | POA: Diagnosis not present

## 2023-11-30 DIAGNOSIS — Z8673 Personal history of transient ischemic attack (TIA), and cerebral infarction without residual deficits: Secondary | ICD-10-CM | POA: Diagnosis not present

## 2023-11-30 DIAGNOSIS — I6509 Occlusion and stenosis of unspecified vertebral artery: Secondary | ICD-10-CM | POA: Diagnosis not present

## 2023-11-30 DIAGNOSIS — N182 Chronic kidney disease, stage 2 (mild): Secondary | ICD-10-CM | POA: Diagnosis not present

## 2023-11-30 DIAGNOSIS — E7800 Pure hypercholesterolemia, unspecified: Secondary | ICD-10-CM | POA: Diagnosis not present

## 2023-11-30 DIAGNOSIS — I10 Essential (primary) hypertension: Secondary | ICD-10-CM | POA: Diagnosis not present

## 2023-11-30 DIAGNOSIS — I2510 Atherosclerotic heart disease of native coronary artery without angina pectoris: Secondary | ICD-10-CM | POA: Diagnosis not present

## 2024-01-13 ENCOUNTER — Telehealth: Payer: Self-pay

## 2024-01-13 NOTE — Telephone Encounter (Signed)
 Per MD called pt to confirm appt on 10/20. Appt. Was confirmed

## 2024-01-16 ENCOUNTER — Inpatient Hospital Stay: Payer: Medicare PPO | Admitting: Hematology and Oncology

## 2024-01-26 ENCOUNTER — Telehealth: Payer: Self-pay

## 2024-01-26 NOTE — Telephone Encounter (Signed)
 Spoke with patient and confirmed appointment on 10/31

## 2024-01-27 ENCOUNTER — Inpatient Hospital Stay: Attending: Hematology and Oncology | Admitting: Hematology and Oncology

## 2024-01-27 VITALS — BP 119/56 | HR 87 | Temp 97.7°F | Resp 18 | Ht 66.0 in | Wt 134.5 lb

## 2024-01-27 DIAGNOSIS — C50411 Malignant neoplasm of upper-outer quadrant of right female breast: Secondary | ICD-10-CM | POA: Insufficient documentation

## 2024-01-27 DIAGNOSIS — Z170 Estrogen receptor positive status [ER+]: Secondary | ICD-10-CM | POA: Diagnosis not present

## 2024-01-27 DIAGNOSIS — Z79811 Long term (current) use of aromatase inhibitors: Secondary | ICD-10-CM | POA: Insufficient documentation

## 2024-01-27 DIAGNOSIS — Z17411 Hormone receptor positive with human epidermal growth factor receptor 2 negative status: Secondary | ICD-10-CM | POA: Insufficient documentation

## 2024-01-27 NOTE — Progress Notes (Signed)
 [facility]  [phone] Fax:([phone]     ID: Mackenzie Gibbs DOB: 1940/11/08  MR#: [id number]  RDW#:[id number]  Patient Care Team: Ransom Other, MD as PCP - General (Internal Medicine) Tyree Nanetta SAILOR, RN as Oncology Nurse Navigator Ebbie Cough, MD as Consulting Physician (General Surgery) Dewey Rush, MD as Consulting Physician (Radiation Oncology) Claudene Victory ORN, MD (Inactive) as Consulting Physician (Cardiology) Waylan Adine, MD as Consulting Physician (Ophthalmology) Jordan, Peter M, MD as Consulting Physician (Cardiology) Amber Stalls, MD OTHER MD:  CHIEF COMPLAINT: estrogen receptor positive breast cancer  CURRENT TREATMENT: Anastrozole    INTERVAL HISTORY:  Mackenzie Gibbs returns today for follow up of her estrogen receptor positive breast cancer.   Discussed the use of AI scribe software for clinical note transcription with the patient, who gave verbal consent to proceed.  History of Present Illness Mackenzie Gibbs is an 83 year old female with breast cancer who presents for follow-up regarding her breast cancer treatment.  She is currently on anastrozole  for breast cancer, initiated in April 2021 post-surgery, with an expected completion in April 2026. Her last mammogram with ultrasound in January showed no concerning findings. She is scheduled for a bone density test on December 9th at 10:30 AM at the [facility].  She experiences a persistent brown vaginal discharge, which she attributes to the anastrozole . Her gynecologist has evaluated the discharge and was not concerned. The discharge is constant, differing from her previous experience of non-brown discharge.  She notes occasional ankle puffiness, particularly after prolonged standing, which resolves after a night's rest.  She is taking vitamin D and calcium supplements as part of her current medication regimen.  Rest of the pertinent 10 point ROS  reviewed and negative.   COVID 19 VACCINATION STATUS: fully vaccinated Laren) with booster x2 as of August 2022  HISTORY OF CURRENT ILLNESS: From the original intake note:  Mackenzie Gibbs had routine screening mammography on 04/13/2019 showing a possible abnormality in the right breast. She underwent right diagnostic mammography with tomography and right breast ultrasonography at [facility] on 04/19/2019 showing: breast density category B; suspicious 0.6 cm right breast mass at 10 o'clock; no suspicious-appearing lymph nodes.  Accordingly on 04/20/2019 she proceeded to biopsy of the right breast area in question. The pathology from this procedure ([id number]) showed: invasive mammary carcinoma with lobular features, grade 2, e-cadherin weakly positive.  (This was presented as lobular on conference 05/02/2019).  Prognostic indicators significant for: estrogen receptor, 90% positive and progesterone receptor, 95% positive, both with strong staining intensity. Proliferation marker Ki67 at 25%. HER2 negative by immunohistochemistry (1+).  The patient's subsequent history is as detailed below.   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 2004   WITH RCA AND CIRCUMFLEX TAXUS DES   Cerebrovascular disease    Diabetes mellitus without complication (HCC)    Dry eyes    Hyperlipidemia    Hypertension    Reflux    Stenosis of left vertebral artery    75%    PAST SURGICAL HISTORY: Past Surgical History:  Procedure Laterality Date   ABDOMINAL HYSTERECTOMY  1988   partial, per patient   BREAST LUMPECTOMY WITH RADIOACTIVE SEED AND SENTINEL LYMPH NODE BIOPSY Right 05/24/2019   Procedure: RADIOACTIVE SEED GUIDED RIGHT BREAST LUMPECTOMY, RIGHT AXIALLARY SENTINEL LYMPH NODE BIOPSY;  Surgeon: Ebbie Cough, MD;  Location: [facility];  Service: General;  Laterality: Right;   cornary  artery stents     2   TONSILLECTOMY      FAMILY HISTORY: Family History  Problem Relation Age of Onset   Stroke Father    Hypertension Mother    Breast cancer Mother 46   Diabetes Other    Hypertension Other    Arthritis Other    Breast cancer Other    Diabetes Other    Coronary artery disease Brother    Alcohol abuse Other   Patient's father was 62 years old when he died from cerebral hemorrhage. Patient's mother died from breast cancer at age 36. She was diagnosed at age 61. The patient denies a family hx of ovarian cancer. She does note a maternal aunt with stomach cancer. She has 1 brother.   GYNECOLOGIC HISTORY:  No LMP recorded. Patient is postmenopausal. Menarche: 83 years old Age at first live birth: 83 years old GX P 3 LMP age 41 due to hysterectomy Contraceptive: never used HRT never used  Hysterectomy? Yes, for uterine fibroids BSO? 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 She teaches piano and organ at  [employer] and regularly plays at her church and at funerals. She is divorced. She lives at home by herself, with no pets. Son Mitchell LITTIE Louder III, age 70, is an Stage Manager in [place], [place]. Daughter Lauralyn JAYSON Louder M.D., age 93, is a podiatrist in [place], [place]. Daughter Sidonie JINNY Seip, age 67, is an event planner in [place]. Mackenzie Gibbs is status post bilateral mastectomies for breast cancer.  The patient has two grandchildren and one great-grandchild. She attends [employer].    ADVANCED DIRECTIVES: Not addressed   HEALTH MAINTENANCE: Social History   Tobacco Use   Smoking status: Former   Smokeless tobacco: Never  Vaping Use   Vaping status: Never Used  Substance Use Topics   Alcohol use: Yes    Alcohol/week: 0.0 standard drinks of alcohol    Comment: social   Drug use: No     Colonoscopy: 2017  PAP: none on file  Bone density: date unknown   Allergies  Allergen Reactions    Dristan Cold [Chlorphen-Pe-Acetaminophen ]     EYES PUFFY   Kiwi Extract     DEATHLY ILL    Current Outpatient Medications  Medication Sig Dispense Refill   anastrozole  (ARIMIDEX ) 1 MG tablet TAKE 1 TABLET(1 MG) BY MOUTH DAILY 90 tablet 4   aspirin EC 81 MG tablet Take 81 mg by mouth daily.     atorvastatin (LIPITOR) 40 MG tablet Take 40 mg by mouth daily.     beta carotene w/minerals (OCUVITE) tablet Take 1 tablet by mouth daily.     COVID-19 mRNA vaccine, Moderna, (MODERNA COVID-19 VACCINE ) 100 MCG/0.5ML injection Inject into the muscle. 0.25 mL 0   cycloSPORINE (RESTASIS) 0.05 % ophthalmic emulsion Place 1 drop into both eyes daily.      glucose blood test strip 1 each by Other route as needed for other. Use as instructed     Lancets 30G MISC by Does not apply route. ONE TOUCH ULTRA 250.00     lisinopril-hydrochlorothiazide (PRINZIDE,ZESTORETIC) 10-12.5 MG per tablet Take 1 tablet by mouth daily.     metFORMIN (GLUCOPHAGE) 1000 MG tablet Take 1,000 mg by mouth 2 (two) times daily.     minoxidil (LONITEN) 2.5 MG tablet Take 1.25 mg by mouth daily.     nitroGLYCERIN  (NITROSTAT ) 0.4 MG SL tablet Place 1 tablet (  0.4 mg total) under the tongue every 5 (five) minutes as needed for chest pain. 25 tablet 1   No current facility-administered medications for this visit.    OBJECTIVE:  African-American woman who appears stated age  There were no vitals filed for this visit.     There is no height or weight on file to calculate BMI.   Wt Readings from Last 3 Encounters:  02/01/23 136 lb (61.7 kg)  01/13/23 139 lb 4.8 oz (63.2 kg)  01/11/22 136 lb 9.6 oz (62 kg)      ECOG FS:1 - Symptomatic but completely ambulatory  Physical Exam Constitutional:      Appearance: Normal appearance.  Chest:     Comments: Bilateral breasts inspected.  No palpable masses.  No regional adenopathy. Both nipples appear healthy. Musculoskeletal:     Cervical back: Normal range of motion. No rigidity.   Lymphadenopathy:     Cervical: No cervical adenopathy.  Neurological:     Mental Status: She is alert.       LAB RESULTS:  CMP     Component Value Date/Time   NA 143 05/20/2021 1325   K 3.6 05/20/2021 1325   CL 105 05/20/2021 1325   CO2 30 05/20/2021 1325   GLUCOSE 107 (H) 05/20/2021 1325   BUN 16 05/20/2021 1325   CREATININE 0.82 05/20/2021 1325   CREATININE 0.93 05/02/2019 1221   CALCIUM 9.9 05/20/2021 1325   PROT 7.4 05/20/2021 1325   ALBUMIN 4.5 05/20/2021 1325   AST 18 05/20/2021 1325   AST 14 (L) 05/02/2019 1221   ALT 21 05/20/2021 1325   ALT 17 05/02/2019 1221   ALKPHOS 70 05/20/2021 1325   BILITOT 0.3 05/20/2021 1325   BILITOT 0.3 05/02/2019 1221   GFRNONAA >60 05/20/2021 1325   GFRNONAA 59 (L) 05/02/2019 1221   GFRAA >60 07/11/2019 1340   GFRAA >60 05/02/2019 1221    No results found for: TOTALPROTELP, ALBUMINELP, A1GS, A2GS, BETS, BETA2SER, GAMS, MSPIKE, SPEI  Lab Results  Component Value Date   WBC 5.5 05/20/2021   NEUTROABS 2.9 05/20/2021   HGB 13.2 05/20/2021   HCT 40.9 05/20/2021   MCV 89.1 05/20/2021   PLT 219 05/20/2021    No results found for: LABCA2  No components found for: OJARJW874  No results for input(s): INR in the last 168 hours.  No results found for: LABCA2  No results found for: RJW800  No results found for: CAN125  No results found for: CAN153  No results found for: CA2729  No components found for: HGQUANT  No results found for: CEA1, CEA / No results found for: CEA1, CEA   No results found for: AFPTUMOR  No results found for: CHROMOGRNA  No results found for: KPAFRELGTCHN, LAMBDASER, KAPLAMBRATIO (kappa/lambda light chains)  No results found for: HGBA, HGBA2QUANT, HGBFQUANT, HGBSQUAN (Hemoglobinopathy evaluation)   No results found for: LDH  No results found for: IRON, TIBC, IRONPCTSAT (Iron and TIBC)  No results found for:  FERRITIN  Urinalysis No results found for: COLORURINE, APPEARANCEUR, LABSPEC, PHURINE, GLUCOSEU, HGBUR, BILIRUBINUR, KETONESUR, PROTEINUR, UROBILINOGEN, NITRITE, LEUKOCYTESUR   STUDIES: No results found.    ELIGIBLE FOR AVAILABLE RESEARCH PROTOCOL: no  ASSESSMENT: 83 y.o. [place] woman status post right breast upper outer quadrant biopsy 04/20/2019 for a clinical T1b N0, stage IA invasive lobular carcinoma (E-cadherin weakly positive), grade 2, estrogen and progesterone receptor positive, HER-2 not amplified, with an MIB-1 of 25%  (1) status post right lumpectomy and sentinel lymph node sampling  05/24/2019 for a pT1C pN0, stage 1A invasive lobular breast cancer, grade 2, with negative margins  (a) a total of 3 axillary lymph nodes were removed  (2) Oncotype score of 16 predicts a risk of recurrence outside the breast in the next 9 years of 4% if the patient's only systemic therapy is antiestrogens for 5 years  (3) patient opted against adjuvant radiation  (4) anastrozole  started 07/11/2019  (a) [facility] a T score of -1.0    PLAN:  Assessment and Plan Assessment & Plan Breast cancer, status post surgery, on adjuvant anastrozole  Post-surgery, on anastrozole  since April 2021, tolerating well. Recent imaging normal. - Continue anastrozole  until April 2026. - Schedule follow-up appointment in April 2026. - Transition to annual follow-up visits post-April 2026.  Vaginal atrophy with chronic brown vaginal discharge Discharge likely due to vaginal atrophy, possibly exacerbated by anastrozole . Gynecologist not concerned about malignancy. - Reassess after discontinuation of anastrozole  in April 2026.  Osteoporosis screening Bone density test scheduled. Currently on vitamin D and calcium. - Ensure attendance for bone density test on March 06, 2024.  Peripheral edema, mild, intermittent Mild intermittent edema,  dependent in nature, likely due to prolonged standing or sitting. - Monitor for changes in edema pattern, especially if present upon waking.   Amber Stalls, MD   01/27/2024 10:08 AM Medical Oncology and Hematology [facility] [address] [place], [place] [address] Tel. [phone]    Fax. [phone]  Total time spent: 30 min  *Total Encounter Time as defined by the Centers for Medicare and Medicaid Services includes, in addition to the face-to-face time of a patient visit (documented in the note above) non-face-to-face time: obtaining and reviewing outside history, ordering and reviewing medications, tests or procedures, care coordination (communications with other health care professionals or caregivers) and documentation in the medical record.

## 2024-03-01 DIAGNOSIS — L219 Seborrheic dermatitis, unspecified: Secondary | ICD-10-CM | POA: Diagnosis not present

## 2024-03-01 DIAGNOSIS — L658 Other specified nonscarring hair loss: Secondary | ICD-10-CM | POA: Diagnosis not present

## 2024-03-06 ENCOUNTER — Ambulatory Visit (HOSPITAL_BASED_OUTPATIENT_CLINIC_OR_DEPARTMENT_OTHER)
Admission: RE | Admit: 2024-03-06 | Discharge: 2024-03-06 | Disposition: A | Source: Ambulatory Visit | Attending: Hematology and Oncology

## 2024-03-06 DIAGNOSIS — Z170 Estrogen receptor positive status [ER+]: Secondary | ICD-10-CM

## 2024-03-07 ENCOUNTER — Ambulatory Visit: Payer: Self-pay | Admitting: Hematology and Oncology

## 2024-03-07 NOTE — Telephone Encounter (Signed)
-----   Message from [place] sent at 03/07/2024  8:31 AM EST ----- Please review bone density results, mild osteopenia, recommend calcium, vit D and weight bearing exercises. ----- Message ----- From: Interface, Rad Results In Sent: 03/06/2024   1:13 PM EST To: Amber Stalls, MD

## 2024-03-07 NOTE — Telephone Encounter (Signed)
 Pt given education and instruction for supplements per MD.

## 2024-03-13 ENCOUNTER — Other Ambulatory Visit: Payer: Self-pay | Admitting: Hematology and Oncology

## 2024-03-13 DIAGNOSIS — Z853 Personal history of malignant neoplasm of breast: Secondary | ICD-10-CM

## 2024-04-27 ENCOUNTER — Encounter: Payer: Self-pay | Admitting: Hematology and Oncology

## 2024-04-27 ENCOUNTER — Ambulatory Visit
Admission: RE | Admit: 2024-04-27 | Discharge: 2024-04-27 | Disposition: A | Source: Ambulatory Visit | Attending: Hematology and Oncology

## 2024-04-27 DIAGNOSIS — Z853 Personal history of malignant neoplasm of breast: Secondary | ICD-10-CM

## 2024-07-24 ENCOUNTER — Inpatient Hospital Stay: Admitting: Hematology and Oncology
